# Patient Record
Sex: Male | Born: 1990
Health system: Southern US, Community
[De-identification: ages and names within clinical notes are randomized; demographics above are authoritative.]

## PROBLEM LIST (undated history)

## (undated) DIAGNOSIS — J3489 Other specified disorders of nose and nasal sinuses: Secondary | ICD-10-CM

## (undated) DIAGNOSIS — D693 Immune thrombocytopenic purpura: Secondary | ICD-10-CM

## (undated) DIAGNOSIS — R05 Cough: Secondary | ICD-10-CM

## (undated) DIAGNOSIS — S62309A Unspecified fracture of unspecified metacarpal bone, initial encounter for closed fracture: Secondary | ICD-10-CM

## (undated) HISTORY — PX: LYMPH NODE BIOPSY: SHX201

## (undated) HISTORY — PX: MANDIBLE FRACTURE SURGERY: SHX706

---
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
## 2011-01-04 ENCOUNTER — Emergency Department (HOSPITAL_COMMUNITY)
Admission: EM | Admit: 2011-01-04 | Discharge: 2011-01-04 | Disposition: A | Discharge status: Home / Self Care | Payer: Medicaid Other | Attending: Emergency Medicine | Admitting: Emergency Medicine

## 2011-01-04 ENCOUNTER — Emergency Department (HOSPITAL_COMMUNITY): Payer: Medicaid Other

## 2011-01-04 DIAGNOSIS — R059 Cough, unspecified: Secondary | ICD-10-CM | POA: Insufficient documentation

## 2011-01-04 DIAGNOSIS — R5381 Other malaise: Secondary | ICD-10-CM | POA: Insufficient documentation

## 2011-01-04 DIAGNOSIS — R220 Localized swelling, mass and lump, head: Secondary | ICD-10-CM | POA: Insufficient documentation

## 2011-01-04 DIAGNOSIS — R05 Cough: Secondary | ICD-10-CM | POA: Insufficient documentation

## 2011-01-04 DIAGNOSIS — J3489 Other specified disorders of nose and nasal sinuses: Secondary | ICD-10-CM | POA: Insufficient documentation

## 2011-01-04 DIAGNOSIS — R070 Pain in throat: Secondary | ICD-10-CM | POA: Insufficient documentation

## 2011-01-04 DIAGNOSIS — J069 Acute upper respiratory infection, unspecified: Secondary | ICD-10-CM | POA: Insufficient documentation

## 2011-01-04 LAB — RAPID STREP SCREEN (MED CTR MEBANE ONLY): Streptococcus, Group A Screen (Direct): NEGATIVE

## 2011-02-03 ENCOUNTER — Emergency Department (HOSPITAL_COMMUNITY)
Admission: EM | Admit: 2011-02-03 | Discharge: 2011-02-03 | Disposition: A | Discharge status: Home / Self Care | Payer: Medicaid Other | Attending: Emergency Medicine | Admitting: Emergency Medicine

## 2011-02-03 ENCOUNTER — Emergency Department (HOSPITAL_COMMUNITY): Payer: Medicaid Other

## 2011-02-03 DIAGNOSIS — W2209XA Striking against other stationary object, initial encounter: Secondary | ICD-10-CM | POA: Insufficient documentation

## 2011-02-03 DIAGNOSIS — M25569 Pain in unspecified knee: Secondary | ICD-10-CM | POA: Insufficient documentation

## 2011-02-26 ENCOUNTER — Emergency Department (HOSPITAL_COMMUNITY)
Admission: EM | Admit: 2011-02-26 | Discharge: 2011-02-26 | Disposition: A | Discharge status: Home / Self Care | Payer: Medicaid Other | Attending: Emergency Medicine | Admitting: Emergency Medicine

## 2011-02-26 DIAGNOSIS — H6690 Otitis media, unspecified, unspecified ear: Secondary | ICD-10-CM | POA: Insufficient documentation

## 2011-02-26 DIAGNOSIS — H9209 Otalgia, unspecified ear: Secondary | ICD-10-CM | POA: Insufficient documentation

## 2011-03-20 ENCOUNTER — Emergency Department (HOSPITAL_COMMUNITY)
Admission: EM | Admit: 2011-03-20 | Discharge: 2011-03-20 | Disposition: A | Discharge status: Home / Self Care | Payer: Medicaid Other | Attending: Emergency Medicine | Admitting: Emergency Medicine

## 2011-03-20 ENCOUNTER — Emergency Department (HOSPITAL_COMMUNITY): Payer: Medicaid Other

## 2011-03-20 DIAGNOSIS — S022XXA Fracture of nasal bones, initial encounter for closed fracture: Secondary | ICD-10-CM | POA: Insufficient documentation

## 2011-03-20 DIAGNOSIS — M25559 Pain in unspecified hip: Secondary | ICD-10-CM | POA: Insufficient documentation

## 2011-03-20 DIAGNOSIS — R220 Localized swelling, mass and lump, head: Secondary | ICD-10-CM | POA: Insufficient documentation

## 2011-03-20 DIAGNOSIS — R51 Headache: Secondary | ICD-10-CM | POA: Insufficient documentation

## 2011-03-20 DIAGNOSIS — J3489 Other specified disorders of nose and nasal sinuses: Secondary | ICD-10-CM | POA: Insufficient documentation

## 2011-03-20 DIAGNOSIS — Y929 Unspecified place or not applicable: Secondary | ICD-10-CM | POA: Insufficient documentation

## 2012-10-26 IMAGING — CR DG CHEST SPECIAL VIEW
1 series · 1 of 1 positions shown · non-contrast
Comparison: 01/04/2011.

CLINICAL DATA: Cough.

CHEST SPECIAL VIEW

[w chest pa]
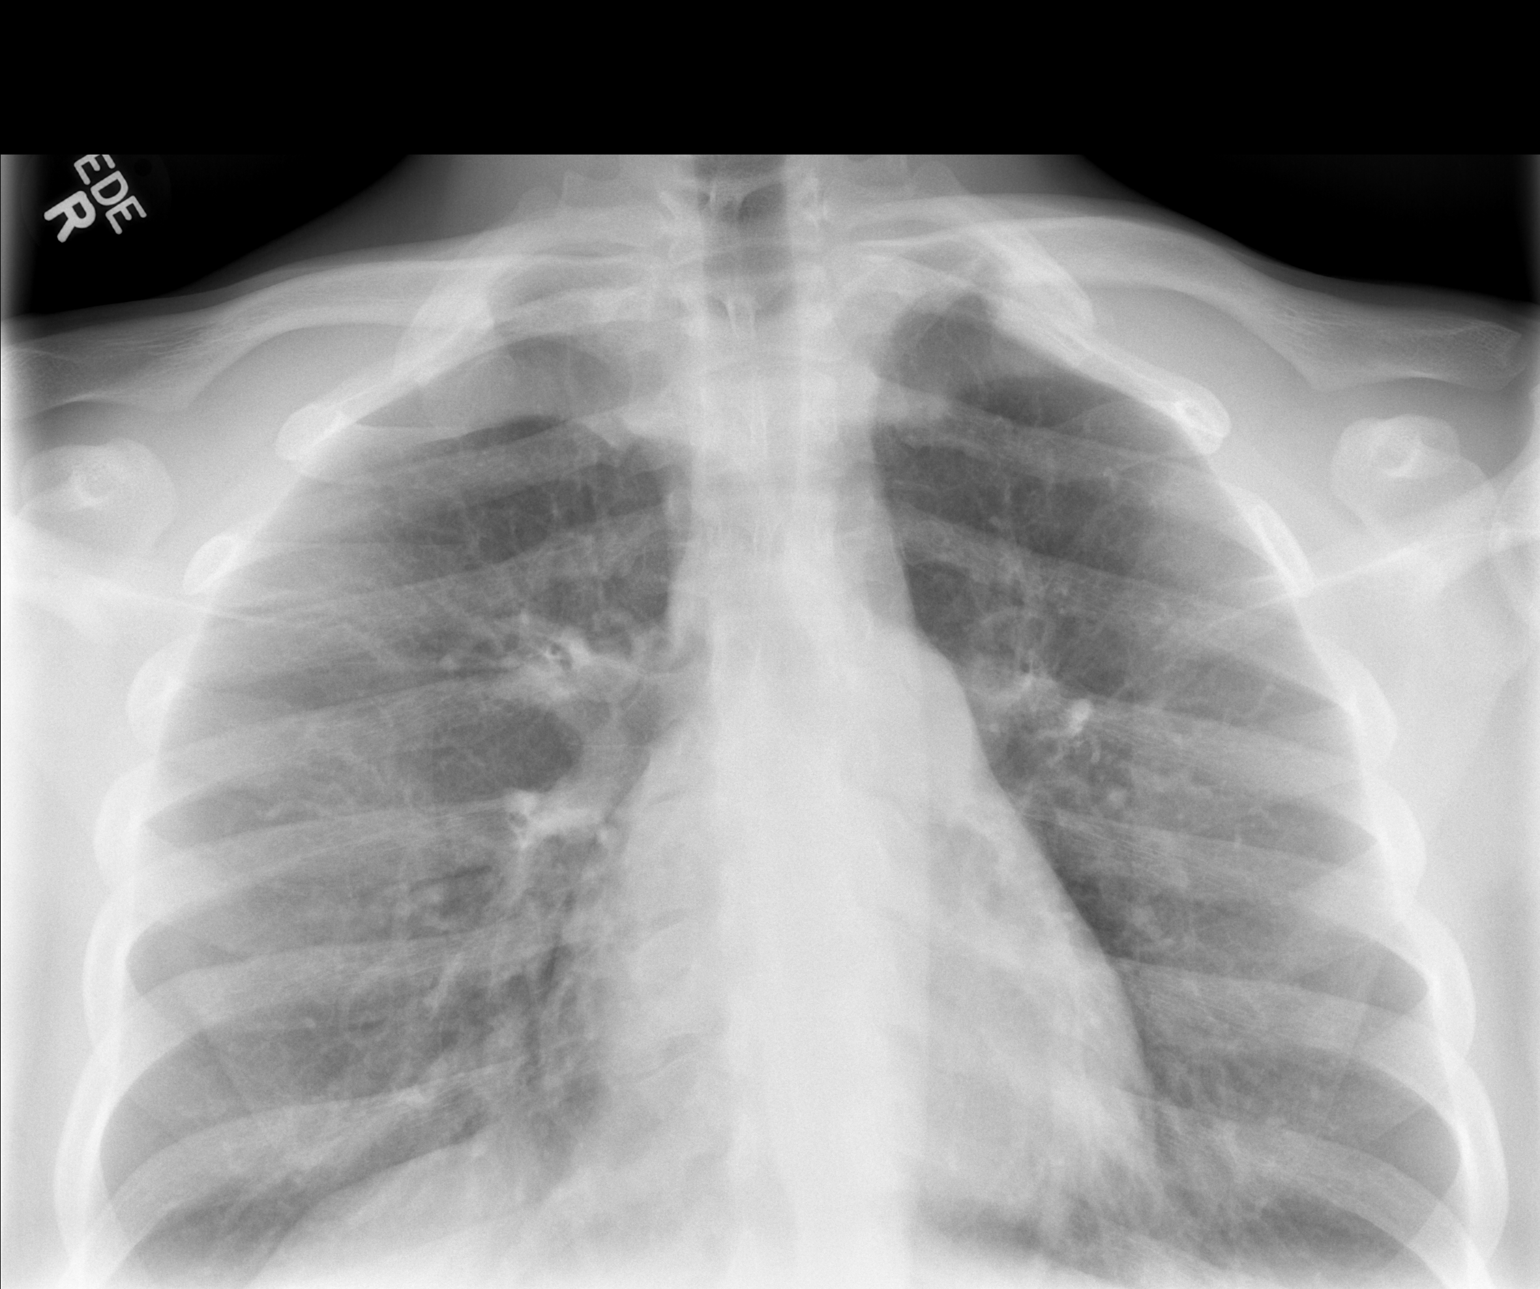

[1 of 1 positions shown; findings below may reference images not displayed]

FINDINGS: An apical lordotic  view demonstrates a poor inspiration.
Normal sized heart.  The previously seen nodular density overlying
the left lung apex appears to represent the normal anterior left
first rib.  No true lung nodule is seen on this view.  Mild central
peribronchial thickening.  Unremarkable bones.
IMPRESSION: The previously questioned left apical nodule is  the normal
anterior aspect of the left first rib.  Mild bronchitic changes.

## 2014-11-08 ENCOUNTER — Other Ambulatory Visit: Payer: Self-pay

## 2017-06-12 DIAGNOSIS — S62309A Unspecified fracture of unspecified metacarpal bone, initial encounter for closed fracture: Secondary | ICD-10-CM

## 2017-06-12 HISTORY — DX: Unspecified fracture of unspecified metacarpal bone, initial encounter for closed fracture: S62.309A

## 2017-06-13 ENCOUNTER — Encounter (HOSPITAL_COMMUNITY): Payer: Self-pay | Admitting: Emergency Medicine

## 2017-06-13 ENCOUNTER — Emergency Department (HOSPITAL_COMMUNITY): Payer: Medicaid Other

## 2017-06-13 ENCOUNTER — Emergency Department (HOSPITAL_COMMUNITY)
Admission: EM | Admit: 2017-06-13 | Discharge: 2017-06-13 | Disposition: A | Discharge status: Home / Self Care | Payer: Medicaid Other | Attending: Emergency Medicine | Admitting: Emergency Medicine

## 2017-06-13 DIAGNOSIS — Y999 Unspecified external cause status: Secondary | ICD-10-CM | POA: Insufficient documentation

## 2017-06-13 DIAGNOSIS — Y939 Activity, unspecified: Secondary | ICD-10-CM | POA: Insufficient documentation

## 2017-06-13 DIAGNOSIS — W2209XA Striking against other stationary object, initial encounter: Secondary | ICD-10-CM | POA: Insufficient documentation

## 2017-06-13 DIAGNOSIS — S62326A Displaced fracture of shaft of fifth metacarpal bone, right hand, initial encounter for closed fracture: Secondary | ICD-10-CM | POA: Insufficient documentation

## 2017-06-13 DIAGNOSIS — F17200 Nicotine dependence, unspecified, uncomplicated: Secondary | ICD-10-CM | POA: Insufficient documentation

## 2017-06-13 DIAGNOSIS — Y929 Unspecified place or not applicable: Secondary | ICD-10-CM | POA: Insufficient documentation

## 2017-06-13 MED ORDER — OXYCODONE-ACETAMINOPHEN 5-325 MG PO TABS
1.0000 | ORAL_TABLET | Freq: Once | ORAL | Status: AC
Start: 2017-06-13 — End: 2017-06-13
  Administered 2017-06-13: 1 via ORAL
  Filled 2017-06-13: qty 1

## 2017-06-13 MED ORDER — OXYCODONE-ACETAMINOPHEN 5-325 MG PO TABS: 1.0000 | ORAL_TABLET | Freq: Three times a day (TID) | ORAL | 0 refills | Status: DC | PRN

## 2017-06-13 NOTE — ED Provider Notes (Signed)
[facility]-EMERGENCY DEPT Provider Note   CSN: [id number] Arrival date & time: 06/13/17  1425  By signing my name below, I, Deland PrettySherilynn Knight, attest that this documentation has been prepared under the direction and in the presence of Amyiah Gaba, PA-C. Electronically Signed: Deland PrettySherilynn Knight, ED Scribe. 06/13/17. 5:06 PM.  History   Chief Complaint Chief Complaint  Patient presents with  . Hand Injury   The history is provided by the patient. No language interpreter was used.   HPI Comments: Gary Foley is a 26 y.o. male who presents to the Emergency Department complaining of gradually worsening right hand pain with associated swelling s/p an injury that occurred last night. Per note, the pt punched a window that did not break upon impact. Movement exacerbates his pain.The pt took one 200mg  of ibuprofen with inadequate relief. The pt is right-handed. He denies h/x of DM. NKDA. He denies numbness, SOB, chest pain, abdominal pain, back pain  Past Medical History:  Diagnosis Date  . History of ITP     There are no active problems to display for this patient.   History reviewed. No pertinent surgical history.     Home Medications    Prior to Admission medications   Medication Sig Start Date End Date Taking? Authorizing Provider  ibuprofen (ADVIL,MOTRIN) 200 MG tablet Take 400 mg by mouth every 6 (six) hours as needed for moderate pain.   Yes [provider]  oxyCODONE-acetaminophen (PERCOCET/ROXICET) 5-325 MG tablet Take 1 tablet by mouth every 8 (eight) hours as needed for severe pain. 06/13/17   Azaliah Carrero, Coral ElseMia A, PA-C    Family History History reviewed. No pertinent family history.  Social History Social History  Substance Use Topics  . Smoking status: Current Every Day Smoker  . Smokeless tobacco: Never Used  . Alcohol use Yes     Allergies   Patient has no known allergies.   Review of Systems Review of Systems  Constitutional: Negative for activity  change.  Respiratory: Negative for shortness of breath.   Cardiovascular: Negative for chest pain.  Gastrointestinal: Negative for abdominal pain.  Musculoskeletal: Negative for back pain.  Skin: Positive for wound. Negative for rash.  Neurological: Negative for numbness.     Physical Exam Updated Vital Signs BP 110/72 (BP Location: Right Arm)   Pulse 98   Temp 99 F (37.2 C)   Resp 17   SpO2 99%   Physical Exam  Constitutional: He appears well-developed.  HENT:  Head: Normocephalic.  Eyes: Conjunctivae are normal.  Neck: Neck supple.  Cardiovascular: Normal rate, regular rhythm and normal heart sounds.   No murmur heard. Pulses:      Radial pulses are 2+ on the right side, and 2+ on the left side.  Pulmonary/Chest: Effort normal.  Abdominal: Soft. He exhibits no distension.  Musculoskeletal:  Tender to palpation over the 5th metacarpal of the right hand and has good strength of the digits against resistance. Sensation intact. Radial pulses 2+.  Neurological: He is alert.  Skin: Skin is warm and dry.  Psychiatric: His behavior is normal.  Nursing note and vitals reviewed.  ED Treatments / Results   DIAGNOSTIC STUDIES: Oxygen Saturation is 96% on RA, adequate by my interpretation.   COORDINATION OF CARE: 4:51 PM-Discussed next steps with pt. Pt verbalized understanding and is agreeable with the plan.   Labs (all labs ordered are listed, but only abnormal results are displayed) Labs Reviewed - No data to display  EKG  EKG Interpretation None  Radiology Dg Hand Complete Right  Result Date: 06/13/2017 CLINICAL DATA:  Right hand pain after punching window last night. EXAM: RIGHT HAND - COMPLETE 3+ VIEW COMPARISON:  None. FINDINGS: Mildly displaced fracture involving the fifth metacarpal is noted. No other abnormality is noted. Joint spaces appear intact. No soft tissue abnormality is noted. IMPRESSION: Mildly displaced fifth metacarpal fracture.  Electronically Signed   By: Lupita RaiderJames  Green Jr, M.D.   On: 06/13/2017 15:17    Procedures Procedures (including critical care time)  Medications Ordered in ED Medications  oxyCODONE-acetaminophen (PERCOCET/ROXICET) 5-325 MG per tablet 1 tablet (1 tablet Oral Given 06/13/17 1703)     Initial Impression / Assessment and Plan / ED Course  I have reviewed the triage vital signs and the nursing notes.  Pertinent labs & imaging results that were available during my care of the patient were reviewed by me and considered in my medical decision making (see chart for details).     Patient X-Ray with displaced fracture to the shaft of the right fifth metacarpal. Pain managed in ED. Ulnar gutter splint placed in the emergency department. The patient was reevaluated after splint placement with good capillary refill and intact sensation. Pt advised to follow up with orthopedics for further evaluation and treatment including a cast after swelling improves.  Pain managed in the department.Will d/c the patient with a short course of pain medication. A 6025-month prescription history query was performed using the [place] CSRS prior to discharge. Patient will be dc home & is agreeable with above plan. I have also discussed reasons to return immediately to the ER.  Patient expresses understanding and agrees with plan.  Final Clinical Impressions(s) / ED Diagnoses   Final diagnoses:  Closed displaced fracture of shaft of fifth metacarpal bone of right hand, initial encounter    New Prescriptions Discharge Medication List as of 06/13/2017  5:38 PM     I personally performed the services described in this documentation, which was scribed in my presence. The recorded information has been reviewed and is accurate.     Barkley BoardsMcDonald, Kean Gautreau A, PA-C 06/13/17 1818    Hady Niemczyk, Coral ElseMia A, PA-C 06/13/17 2017    Tegeler, Canary Brimhristopher J, MD 06/14/17 [phone]

## 2017-06-13 NOTE — ED Triage Notes (Signed)
Pt reports R hand pain since last night when he punched a window. Window did not break, but pt has had pain in swelling in his posterior palm, worst towards pinky finger.

## 2017-06-13 NOTE — Discharge Instructions (Signed)
Please call and schedule a follow up appointment with Dr. Ronie SpiesWeingold's office to schedule a follow up appointment to have a cast placed early next week. You can take 800 mg of ibuprofen every 8 hours as needed for pain and inflammation control. You can apply ice to any areas that are sore for 15-20 minutes up to 3-4 times a day. If you have new or worsening symptoms, including numbness, weakness, or new injuries to the hand, please return to the emergency department for reevaluation.

## 2017-06-17 ENCOUNTER — Other Ambulatory Visit: Payer: Self-pay | Admitting: Orthopedic Surgery

## 2017-06-17 ENCOUNTER — Encounter (HOSPITAL_BASED_OUTPATIENT_CLINIC_OR_DEPARTMENT_OTHER): Payer: Self-pay | Admitting: *Deleted

## 2017-06-17 DIAGNOSIS — R059 Cough, unspecified: Secondary | ICD-10-CM

## 2017-06-17 DIAGNOSIS — J3489 Other specified disorders of nose and nasal sinuses: Secondary | ICD-10-CM

## 2017-06-17 HISTORY — DX: Other specified disorders of nose and nasal sinuses: J34.89

## 2017-06-17 HISTORY — DX: Cough, unspecified: R05.9

## 2017-06-18 ENCOUNTER — Encounter (HOSPITAL_BASED_OUTPATIENT_CLINIC_OR_DEPARTMENT_OTHER)
Admission: RE | Disposition: A | Discharge status: Home / Self Care | Payer: Self-pay | Source: Ambulatory Visit | Attending: Orthopedic Surgery

## 2017-06-18 ENCOUNTER — Ambulatory Visit (HOSPITAL_BASED_OUTPATIENT_CLINIC_OR_DEPARTMENT_OTHER)
Admission: RE | Admit: 2017-06-18 | Discharge: 2017-06-18 | Disposition: A | Discharge status: Home / Self Care | Payer: Self-pay | Source: Ambulatory Visit | Attending: Orthopedic Surgery | Admitting: Orthopedic Surgery

## 2017-06-18 ENCOUNTER — Ambulatory Visit (HOSPITAL_BASED_OUTPATIENT_CLINIC_OR_DEPARTMENT_OTHER): Payer: Self-pay | Admitting: Anesthesiology

## 2017-06-18 ENCOUNTER — Encounter (HOSPITAL_BASED_OUTPATIENT_CLINIC_OR_DEPARTMENT_OTHER): Payer: Self-pay | Admitting: Anesthesiology

## 2017-06-18 DIAGNOSIS — S62326A Displaced fracture of shaft of fifth metacarpal bone, right hand, initial encounter for closed fracture: Secondary | ICD-10-CM | POA: Insufficient documentation

## 2017-06-18 DIAGNOSIS — F17200 Nicotine dependence, unspecified, uncomplicated: Secondary | ICD-10-CM | POA: Insufficient documentation

## 2017-06-18 DIAGNOSIS — W2209XA Striking against other stationary object, initial encounter: Secondary | ICD-10-CM | POA: Insufficient documentation

## 2017-06-18 DIAGNOSIS — D759 Disease of blood and blood-forming organs, unspecified: Secondary | ICD-10-CM | POA: Insufficient documentation

## 2017-06-18 HISTORY — DX: Other specified disorders of nose and nasal sinuses: J34.89

## 2017-06-18 HISTORY — DX: Unspecified fracture of unspecified metacarpal bone, initial encounter for closed fracture: S62.309A

## 2017-06-18 HISTORY — PX: OPEN REDUCTION INTERNAL FIXATION (ORIF) METACARPAL: SHX6234

## 2017-06-18 HISTORY — DX: Immune thrombocytopenic purpura: D69.3

## 2017-06-18 HISTORY — DX: Cough: R05

## 2017-06-18 SURGERY — OPEN REDUCTION INTERNAL FIXATION (ORIF) METACARPAL
Anesthesia: General | Site: Hand | Laterality: Right

## 2017-06-18 MED ORDER — LIDOCAINE HCL (CARDIAC) 20 MG/ML IV SOLN
INTRAVENOUS | Status: DC | PRN
  Administered 2017-06-18: 30 mg via INTRAVENOUS

## 2017-06-18 MED ORDER — FENTANYL CITRATE (PF) 100 MCG/2ML IJ SOLN
INTRAMUSCULAR | Status: AC
  Filled 2017-06-18: qty 2

## 2017-06-18 MED ORDER — MIDAZOLAM HCL 2 MG/2ML IJ SOLN
INTRAMUSCULAR | Status: AC
  Filled 2017-06-18: qty 2

## 2017-06-18 MED ORDER — CHLORHEXIDINE GLUCONATE 4 % EX LIQD: 60.0000 mL | Freq: Once | CUTANEOUS | Status: DC

## 2017-06-18 MED ORDER — OXYCODONE-ACETAMINOPHEN 5-325 MG PO TABS: 1.0000 | ORAL_TABLET | ORAL | 0 refills | Status: DC | PRN

## 2017-06-18 MED ORDER — FENTANYL CITRATE (PF) 100 MCG/2ML IJ SOLN: 50.0000 ug | INTRAMUSCULAR | Status: DC | PRN

## 2017-06-18 MED ORDER — SCOPOLAMINE 1 MG/3DAYS TD PT72: 1.0000 | MEDICATED_PATCH | Freq: Once | TRANSDERMAL | Status: DC | PRN

## 2017-06-18 MED ORDER — ONDANSETRON HCL 4 MG/2ML IJ SOLN
INTRAMUSCULAR | Status: DC | PRN
  Administered 2017-06-18: 4 mg via INTRAVENOUS

## 2017-06-18 MED ORDER — CEFAZOLIN SODIUM-DEXTROSE 2-4 GM/100ML-% IV SOLN
INTRAVENOUS | Status: AC
  Filled 2017-06-18: qty 100

## 2017-06-18 MED ORDER — FENTANYL CITRATE (PF) 100 MCG/2ML IJ SOLN
INTRAMUSCULAR | Status: DC | PRN
  Administered 2017-06-18: 25 ug via INTRAVENOUS
  Administered 2017-06-18: 100 ug via INTRAVENOUS
  Administered 2017-06-18 (×5): 25 ug via INTRAVENOUS

## 2017-06-18 MED ORDER — OXYCODONE HCL 5 MG PO TABS
5.0000 mg | ORAL_TABLET | Freq: Once | ORAL | Status: AC
Start: 2017-06-18 — End: 2017-06-18
  Administered 2017-06-18: 5 mg via ORAL

## 2017-06-18 MED ORDER — LACTATED RINGERS IV SOLN
INTRAVENOUS | Status: DC
  Administered 2017-06-18 (×2): via INTRAVENOUS

## 2017-06-18 MED ORDER — HYDROMORPHONE HCL 1 MG/ML IJ SOLN
INTRAMUSCULAR | Status: AC
  Filled 2017-06-18: qty 0.5

## 2017-06-18 MED ORDER — ONDANSETRON HCL 4 MG/2ML IJ SOLN
INTRAMUSCULAR | Status: AC
  Filled 2017-06-18: qty 2

## 2017-06-18 MED ORDER — DEXAMETHASONE SODIUM PHOSPHATE 4 MG/ML IJ SOLN
INTRAMUSCULAR | Status: DC | PRN
  Administered 2017-06-18: 10 mg via INTRAVENOUS

## 2017-06-18 MED ORDER — MIDAZOLAM HCL 5 MG/5ML IJ SOLN
INTRAMUSCULAR | Status: DC | PRN
  Administered 2017-06-18: 2 mg via INTRAVENOUS

## 2017-06-18 MED ORDER — CEFAZOLIN SODIUM-DEXTROSE 2-4 GM/100ML-% IV SOLN
2.0000 g | INTRAVENOUS | Status: AC
  Administered 2017-06-18: 2 g via INTRAVENOUS

## 2017-06-18 MED ORDER — OXYCODONE HCL 5 MG PO TABS
ORAL_TABLET | ORAL | Status: AC
  Filled 2017-06-18: qty 1

## 2017-06-18 MED ORDER — HYDROMORPHONE HCL 1 MG/ML IJ SOLN
0.2500 mg | INTRAMUSCULAR | Status: DC | PRN
  Administered 2017-06-18: 0.5 mg via INTRAVENOUS

## 2017-06-18 MED ORDER — BUPIVACAINE HCL (PF) 0.25 % IJ SOLN
INTRAMUSCULAR | Status: DC | PRN
  Administered 2017-06-18: 10 mL

## 2017-06-18 MED ORDER — PROPOFOL 10 MG/ML IV BOLUS
INTRAVENOUS | Status: DC | PRN
  Administered 2017-06-18: 200 mg via INTRAVENOUS

## 2017-06-18 MED ORDER — LIDOCAINE 2% (20 MG/ML) 5 ML SYRINGE
INTRAMUSCULAR | Status: AC
  Filled 2017-06-18: qty 5

## 2017-06-18 MED ORDER — PROPOFOL 10 MG/ML IV BOLUS
INTRAVENOUS | Status: AC
  Filled 2017-06-18: qty 20

## 2017-06-18 MED ORDER — MIDAZOLAM HCL 2 MG/2ML IJ SOLN: 1.0000 mg | INTRAMUSCULAR | Status: DC | PRN

## 2017-06-18 MED ORDER — DEXAMETHASONE SODIUM PHOSPHATE 10 MG/ML IJ SOLN
INTRAMUSCULAR | Status: AC
  Filled 2017-06-18: qty 1

## 2017-06-18 SURGICAL SUPPLY — 67 items
APL SKNCLS STERI-STRIP NONHPOA (GAUZE/BANDAGES/DRESSINGS) ×1
BANDAGE ACE 3X5.8 VEL STRL LF (GAUZE/BANDAGES/DRESSINGS) IMPLANT
BANDAGE ACE 4X5 VEL STRL LF (GAUZE/BANDAGES/DRESSINGS) ×3 IMPLANT
BENZOIN TINCTURE PRP APPL 2/3 (GAUZE/BANDAGES/DRESSINGS) ×3 IMPLANT
BIT DRILL 1.1 (BIT) ×2
BIT DRILL 1.1MM (BIT) ×1
BIT DRILL 60X20X1.1XQC TMX (BIT) ×1 IMPLANT
BIT DRL 60X20X1.1XQC TMX (BIT) ×1
BLADE SURG 15 STRL LF DISP TIS (BLADE) ×1 IMPLANT
BLADE SURG 15 STRL SS (BLADE) ×3
BNDG CMPR 9X4 STRL LF SNTH (GAUZE/BANDAGES/DRESSINGS) ×1
BNDG ELASTIC 2X5.8 VLCR STR LF (GAUZE/BANDAGES/DRESSINGS) IMPLANT
BNDG ESMARK 4X9 LF (GAUZE/BANDAGES/DRESSINGS) ×3 IMPLANT
BNDG GAUZE ELAST 4 BULKY (GAUZE/BANDAGES/DRESSINGS) ×3 IMPLANT
CANISTER SUCT 1200ML W/VALVE (MISCELLANEOUS) IMPLANT
CLOSURE WOUND 1/2 X4 (GAUZE/BANDAGES/DRESSINGS) ×1
CORD BIPOLAR FORCEPS 12FT (ELECTRODE) ×3 IMPLANT
COVER BACK TABLE 60X90IN (DRAPES) ×3 IMPLANT
CUFF TOURNIQUET SINGLE 18IN (TOURNIQUET CUFF) ×3 IMPLANT
DECANTER SPIKE VIAL GLASS SM (MISCELLANEOUS) IMPLANT
DRAPE EXTREMITY T 121X128X90 (DRAPE) ×3 IMPLANT
DRAPE OEC MINIVIEW 54X84 (DRAPES) ×3 IMPLANT
DRAPE SURG 17X23 STRL (DRAPES) ×3 IMPLANT
DURAPREP 26ML APPLICATOR (WOUND CARE) ×3 IMPLANT
GAUZE SPONGE 4X4 12PLY STRL (GAUZE/BANDAGES/DRESSINGS) ×3 IMPLANT
GAUZE SPONGE 4X4 16PLY XRAY LF (GAUZE/BANDAGES/DRESSINGS) IMPLANT
GAUZE XEROFORM 1X8 LF (GAUZE/BANDAGES/DRESSINGS) IMPLANT
GLOVE SURG SYN 8.0 (GLOVE) ×3 IMPLANT
GOWN STRL REUS W/ TWL LRG LVL3 (GOWN DISPOSABLE) ×1 IMPLANT
GOWN STRL REUS W/TWL LRG LVL3 (GOWN DISPOSABLE) ×3
GOWN STRL REUS W/TWL XL LVL3 (GOWN DISPOSABLE) ×6 IMPLANT
NEEDLE HYPO 25X1 1.5 SAFETY (NEEDLE) IMPLANT
NS IRRIG 1000ML POUR BTL (IV SOLUTION) IMPLANT
PACK BASIN DAY SURGERY FS (CUSTOM PROCEDURE TRAY) ×3 IMPLANT
PAD CAST 3X4 CTTN HI CHSV (CAST SUPPLIES) ×1 IMPLANT
PAD CAST 4YDX4 CTTN HI CHSV (CAST SUPPLIES) IMPLANT
PADDING CAST ABS 4INX4YD NS (CAST SUPPLIES) ×2
PADDING CAST ABS COTTON 4X4 ST (CAST SUPPLIES) ×1 IMPLANT
PADDING CAST COTTON 3X4 STRL (CAST SUPPLIES) ×3
PADDING CAST COTTON 4X4 STRL (CAST SUPPLIES)
PADDING UNDERCAST 2 STRL (CAST SUPPLIES) ×2
PADDING UNDERCAST 2X4 STRL (CAST SUPPLIES) ×1 IMPLANT
PLATE STRAIGHT 1.5 6 HOLE (Plate) ×3 IMPLANT
SCREW CORTEX 1.5X10 (Screw) ×6 IMPLANT
SCREW CORTEX 1.5X11 (Screw) ×3 IMPLANT
SCREW CORTEX 1.5X12 (Screw) ×3 IMPLANT
SCREW CORTEX 1.5X8 (Screw) ×3 IMPLANT
SHEET MEDIUM DRAPE 40X70 STRL (DRAPES) ×3 IMPLANT
SLEEVE SCD COMPRESS KNEE MED (MISCELLANEOUS) ×3 IMPLANT
SPLINT PLASTER CAST XFAST 4X15 (CAST SUPPLIES) IMPLANT
SPLINT PLASTER XTRA FAST SET 4 (CAST SUPPLIES)
STOCKINETTE 4X48 STRL (DRAPES) ×3 IMPLANT
STRIP CLOSURE SKIN 1/2X4 (GAUZE/BANDAGES/DRESSINGS) ×2 IMPLANT
SUCTION FRAZIER HANDLE 10FR (MISCELLANEOUS)
SUCTION TUBE FRAZIER 10FR DISP (MISCELLANEOUS) IMPLANT
SUT MERSILENE 4 0 P 3 (SUTURE) IMPLANT
SUT PROLENE 3 0 PS 2 (SUTURE) ×3 IMPLANT
SUT VIC AB 4-0 P-3 18XBRD (SUTURE) IMPLANT
SUT VIC AB 4-0 P3 18 (SUTURE)
SUT VICRYL 4-0 PS2 18IN ABS (SUTURE) ×3 IMPLANT
SUT VICRYL+ 3-0 27IN RB-1 (SUTURE) IMPLANT
SYR 10ML LL (SYRINGE) IMPLANT
SYR BULB 3OZ (MISCELLANEOUS) IMPLANT
TOWEL OR 17X24 6PK STRL BLUE (TOWEL DISPOSABLE) ×3 IMPLANT
TUBE CONNECTING 20'X1/4 (TUBING)
TUBE CONNECTING 20X1/4 (TUBING) IMPLANT
UNDERPAD 30X30 (UNDERPADS AND DIAPERS) ×3 IMPLANT

## 2017-06-18 NOTE — Transfer of Care (Signed)
Immediate Anesthesia Transfer of Care Note  Patient: Gary GoldenStephen R Foley  Procedure(s) Performed: Procedure(s): OPEN REDUCTION INTERNAL FIXATION (ORIF) RIGHT SMALL METACARPAL FRACTURE (Right)  Patient Location: PACU  Anesthesia Type:General  Level of Consciousness: sedated and responds to stimulation  Airway & Oxygen Therapy: Patient Spontanous Breathing and Patient connected to face mask oxygen  Post-op Assessment: Report given to RN and Post -op Vital signs reviewed and stable  Post vital signs: Reviewed and stable  Last Vitals:  Vitals:   06/18/17 1150  BP: 134/82  Pulse: 78  Resp: 16  Temp: 36.7 C    Last Pain:  Vitals:   06/18/17 1150  TempSrc: Oral  PainSc: 8       Patients Stated Pain Goal: 3 (06/18/17 1150)  Complications: No apparent anesthesia complications

## 2017-06-18 NOTE — Anesthesia Preprocedure Evaluation (Addendum)
Anesthesia Evaluation  Patient identified by MRN, date of birth, ID band Patient awake    Reviewed: Allergy & Precautions, H&P , NPO status , Patient's Chart, lab work & pertinent test results  Airway Mallampati: I  TM Distance: >3 FB Neck ROM: Full    Dental no notable dental hx. (+) Teeth Intact, Dental Advisory Given   Pulmonary Current Smoker,    Pulmonary exam normal breath sounds clear to auscultation       Cardiovascular negative cardio ROS   Rhythm:Regular Rate:Normal     Neuro/Psych negative neurological ROS  negative psych ROS   GI/Hepatic negative GI ROS, Neg liver ROS,   Endo/Other  negative endocrine ROS  Renal/GU negative Renal ROS  negative genitourinary   Musculoskeletal   Abdominal   Peds  Hematology  (+) Blood dyscrasia, ,   Anesthesia Other Findings   Reproductive/Obstetrics negative OB ROS                            Anesthesia Physical Anesthesia Plan  ASA: II  Anesthesia Plan: General   Post-op Pain Management:    Induction: Intravenous  PONV Risk Score and Plan: 1 and Ondansetron, Dexamethasone and Midazolam  Airway Management Planned: LMA  Additional Equipment:   Intra-op Plan:   Post-operative Plan: Extubation in OR  Informed Consent: I have reviewed the patients History and Physical, chart, labs and discussed the procedure including the risks, benefits and alternatives for the proposed anesthesia with the patient or authorized representative who has indicated his/her understanding and acceptance.   Dental advisory given  Plan Discussed with: CRNA  Anesthesia Plan Comments:        Anesthesia Quick Evaluation

## 2017-06-18 NOTE — Anesthesia Postprocedure Evaluation (Signed)
Anesthesia Post Note  Patient: Gary Foley  Procedure(s) Performed: Procedure(s) (LRB): OPEN REDUCTION INTERNAL FIXATION (ORIF) RIGHT SMALL METACARPAL FRACTURE (Right)     Patient location during evaluation: PACU Anesthesia Type: General Level of consciousness: awake and alert Pain management: pain level controlled Vital Signs Assessment: post-procedure vital signs reviewed and stable Respiratory status: spontaneous breathing, nonlabored ventilation and respiratory function stable Cardiovascular status: blood pressure returned to baseline and stable Postop Assessment: no signs of nausea or vomiting Anesthetic complications: no    Last Vitals:  Vitals:   06/18/17 1441 06/18/17 1453  BP:  (!) 145/90  Pulse: 66 82  Resp: 16 16  Temp:  36.6 C    Last Pain:  Vitals:   06/18/17 1453  TempSrc:   PainSc: 3                  Laiyah Exline,W. EDMOND

## 2017-06-18 NOTE — Anesthesia Procedure Notes (Signed)
Procedure Name: LMA Insertion Date/Time: 06/18/2017 1:03 PM Performed by: Genevieve NorlanderLINKA, Haitham Dolinsky L Pre-anesthesia Checklist: Patient identified, Emergency Drugs available, Suction available, Patient being monitored and Timeout performed Patient Re-evaluated:Patient Re-evaluated prior to induction Oxygen Delivery Method: Circle system utilized Preoxygenation: Pre-oxygenation with 100% oxygen Induction Type: IV induction Ventilation: Mask ventilation without difficulty LMA: LMA inserted LMA Size: 5.0 Number of attempts: 1 Airway Equipment and Method: Bite block Placement Confirmation: positive ETCO2 Tube secured with: Tape Dental Injury: Teeth and Oropharynx as per pre-operative assessment

## 2017-06-18 NOTE — H&P (Signed)
Gary Foley is an 26 y.o. male.   Chief Complaint: Right hand pain and swelling HPI: Patient's a very pleasant 26 year old right-hand-dominant male status post right hand trauma with displaced fracture of his small metacarpal in the mid to distal third of the shaft  Past Medical History:  Diagnosis Date  . Cough 06/17/2017  . Idiopathic thrombocytopenia purpura (HCC)    no PCP  . Metacarpal bone fracture 06/12/2017   right small  . Stuffy and runny nose 06/17/2017   green/yellow drainage from nose, per pt.    Past Surgical History:  Procedure Laterality Date  . LYMPH NODE BIOPSY     neck - as a child  . MANDIBLE FRACTURE SURGERY      History reviewed. No pertinent family history. Social History:  reports that he has been smoking.  He has a 14.00 pack-year smoking history. He has never used smokeless tobacco. He reports that he drinks alcohol. He reports that he does not use drugs.  Allergies: No Known Allergies  Medications Prior to Admission  Medication Sig Dispense Refill  . oxyCODONE-acetaminophen (PERCOCET/ROXICET) 5-325 MG tablet Take 1 tablet by mouth every 8 (eight) hours as needed for severe pain. 9 tablet 0    No results found for this or any previous visit (from the past 48 hour(s)). No results found.  Review of Systems  All other systems reviewed and are negative.   Blood pressure 134/82, pulse 78, temperature 98 F (36.7 C), temperature source Oral, resp. rate 16, height 6\' 1"  (1.854 m), weight 78 kg (172 lb), SpO2 100 %. Physical Exam  Constitutional: He is oriented to person, place, and time. He appears well-developed and well-nourished.  HENT:  Head: Normocephalic and atraumatic.  Neck: Normal range of motion.  Cardiovascular: Normal rate.   Respiratory: Effort normal.  Musculoskeletal:       Right hand: He exhibits tenderness, bony tenderness and deformity.  Right hand pain, swelling, and deformity to the small finger distally at metacarpal  phalangeal joint  Neurological: He is alert and oriented to person, place, and time.  Skin: Skin is warm.  Psychiatric: He has a normal mood and affect. His behavior is normal. Judgment and thought content normal.     Assessment/Plan As above. Plan on open reduction and internal fixation of displaced right small metacarpal fracture as an outpatient.  Marlowe ShoresMatthew A Braden Deloach, MD 06/18/2017, 12:40 PM

## 2017-06-18 NOTE — Op Note (Signed)
Please see dictated report [phone]

## 2017-06-18 NOTE — Discharge Instructions (Signed)

## 2017-06-19 ENCOUNTER — Encounter (HOSPITAL_BASED_OUTPATIENT_CLINIC_OR_DEPARTMENT_OTHER): Payer: Self-pay | Admitting: Orthopedic Surgery

## 2017-06-19 NOTE — Op Note (Signed)
Gary Foley, Gary Foley               ACCOUNT NO.:  [id number]  MEDICAL RECORD NO.:  [id number]  LOCATION:                                 FACILITY:  PHYSICIAN:  Artist PaisMatthew A. Mina MarbleWeingold, M.D.   DATE OF BIRTH:  DATE OF PROCEDURE:  06/18/2017 DATE OF DISCHARGE:                              OPERATIVE REPORT   PREOPERATIVE DIAGNOSIS:  Displaced right small metacarpal fracture.  POSTOPERATIVE DIAGNOSIS:  Displaced right small metacarpal fracture.  PROCEDURE:  Open reduction internal fixation above using 1.5 mm plate and screw construct.  SURGEON:  Artist PaisMatthew A. Mina MarbleWeingold, M.D.  ASSISTANT:  None.  ANESTHESIA:  General.  COMPLICATION:  None.  DRAINS:  None.  DESCRIPTION OF PROCEDURE:  The patient was taken to the operating suite after induction of adequate general anesthetic.  Right upper extremity was prepped and draped in sterile fashion.  An Esmarch was used to exsanguinate the limb.  Tourniquet was inflated to 250 mmHg.  At this point in time, incision was made over the dorsal aspect of the right small finger.  The skin was incised sharply.  Blunt dissection was carried down the extensor tendons involving the small finger on the right side.  We developed the interval between the [facility] and EDQ tendons. These were retracted radially and ulnarly, and then a subperiosteal dissection was taken down to the fracture site.  This was debrided of clot.  Reduction was performed with longitudinal traction and downward pressure.  We then placed a single lag screw from dorsal to volar and from ulnar to radial under direct fluoroscopic guidance to stabilize the fracture.  We then placed a 6-hole plate just dorsal to the lag screw with 1 cortical screw distal and 3 cortical screws proximal. Intraoperative fluoroscopy revealed adequate reduction AP, lateral, oblique view.  The wound was irrigated and loosely closed in layers of 4- 0 Vicryl to cover the hardware and a 3-0 Prolene subcuticular stitch  on the skin.  Steri-Strips, 4x4s, fluffs, and an ulnar gutter splint was applied.  The patient tolerated this procedure well, went to recovery room in stable fashion.    Artist PaisMatthew A. Mina MarbleWeingold, M.D.    MAW/MEDQ  D:  06/18/2017  T:  06/18/2017  Job:  [id number]

## 2017-07-07 ENCOUNTER — Emergency Department (HOSPITAL_COMMUNITY)
Admission: EM | Admit: 2017-07-07 | Discharge: 2017-07-07 | Disposition: A | Discharge status: Home / Self Care | Payer: Self-pay | Attending: Emergency Medicine | Admitting: Emergency Medicine

## 2017-07-07 ENCOUNTER — Encounter (HOSPITAL_COMMUNITY): Payer: Self-pay | Admitting: Emergency Medicine

## 2017-07-07 DIAGNOSIS — L299 Pruritus, unspecified: Secondary | ICD-10-CM | POA: Insufficient documentation

## 2017-07-07 DIAGNOSIS — F17200 Nicotine dependence, unspecified, uncomplicated: Secondary | ICD-10-CM | POA: Insufficient documentation

## 2017-07-07 DIAGNOSIS — F1490 Cocaine use, unspecified, uncomplicated: Secondary | ICD-10-CM | POA: Insufficient documentation

## 2017-07-07 MED ORDER — PERMETHRIN 5 % EX CREA: TOPICAL_CREAM | CUTANEOUS | 0 refills | Status: AC

## 2017-07-07 NOTE — ED Triage Notes (Signed)
Patient states he has had worms come out of his body. Patient does not have any worms showing at this time.

## 2017-07-07 NOTE — Discharge Instructions (Signed)
Return if any problems.

## 2017-07-12 NOTE — ED Provider Notes (Signed)
[facility]-EMERGENCY DEPT Provider Note   CSN: [id number] Arrival date & time: 07/07/17  1905     History   Chief Complaint Chief Complaint  Patient presents with  . worms    HPI Gary Foley is a 26 y.o. male.  Pt complains of worms under his skin.  Pt reports he used cocaine today.  Pt reports he is able to pull worms out.      Rash   This is a new problem. The current episode started 2 days ago. The problem has not changed since onset.The problem is associated with nothing. There has been no fever. The rash is present on the torso. The pain is moderate. The pain has been constant since onset. He has tried nothing for the symptoms. The treatment provided no relief. Risk factors include new medications.    Past Medical History:  Diagnosis Date  . Cough 06/17/2017  . Idiopathic thrombocytopenia purpura (HCC)    no PCP  . Metacarpal bone fracture 06/12/2017   right small  . Stuffy and runny nose 06/17/2017   green/yellow drainage from nose, per pt.    There are no active problems to display for this patient.   Past Surgical History:  Procedure Laterality Date  . LYMPH NODE BIOPSY     neck - as a child  . MANDIBLE FRACTURE SURGERY    . OPEN REDUCTION INTERNAL FIXATION (ORIF) METACARPAL Right 06/18/2017   Procedure: OPEN REDUCTION INTERNAL FIXATION (ORIF) RIGHT SMALL METACARPAL FRACTURE;  Surgeon: Dairl Ponder, MD;  Location: [facility];  Service: Orthopedics;  Laterality: Right;       Home Medications    Prior to Admission medications   Medication Sig Start Date End Date Taking? Authorizing Provider  oxyCODONE-acetaminophen (PERCOCET/ROXICET) 5-325 MG tablet Take 1 tablet by mouth every 8 (eight) hours as needed for severe pain. 06/13/17   McDonald, Mia A, PA-C  oxyCODONE-acetaminophen (ROXICET) 5-325 MG tablet Take 1 tablet by mouth every 4 (four) hours as needed for severe pain. 06/18/17   Dairl Ponder, MD  permethrin (ELIMITE) 5 % cream  Apply to affected area once 07/07/17   Elson Areas, PA-C    Family History History reviewed. No pertinent family history.  Social History Social History  Substance Use Topics  . Smoking status: Current Every Day Smoker    Packs/day: 1.00    Years: 14.00  . Smokeless tobacco: Never Used  . Alcohol use Yes     Comment: 1 beer/day     Allergies   Patient has no known allergies.   Review of Systems Review of Systems  Skin: Negative for rash.  All other systems reviewed and are negative.    Physical Exam Updated Vital Signs BP (!) 160/105 (BP Location: Right Arm)   Pulse (!) 106   Temp 98.1 F (36.7 C) (Oral)   Resp 16   Ht 6\' 1"  (1.854 m)   Wt 78 kg (172 lb)   SpO2 96%   BMI 22.69 kg/m   Physical Exam  Constitutional: He appears well-developed and well-nourished.  HENT:  Head: Normocephalic and atraumatic.  Eyes: Conjunctivae are normal.  Neck: Neck supple.  Cardiovascular: Normal rate and regular rhythm.   No murmur heard. Pulmonary/Chest: Effort normal and breath sounds normal. No respiratory distress.  Abdominal: Soft. There is no tenderness.  Musculoskeletal: He exhibits no edema.  Neurological: He is alert.  Skin: Skin is warm and dry. Rash noted.  Pt picking off dry skin.  Pt has  small red areas,    Psychiatric: He has a normal mood and affect.  Nursing note and vitals reviewed.    ED Treatments / Results  Labs (all labs ordered are listed, but only abnormal results are displayed) Labs Reviewed - No data to display  EKG  EKG Interpretation None       Radiology No results found.  Procedures Procedures (including critical care time)  Medications Ordered in ED Medications - No data to display Pt might have scabies,  No worms.   I will treat him with elemite.   Initial Impression / Assessment and Plan / ED Course  I have reviewed the triage vital signs and the nursing notes.  Pertinent labs & imaging results that were available  during my care of the patient were reviewed by me and considered in my medical decision making (see chart for details).       Final Clinical Impressions(s) / ED Diagnoses   Final diagnoses:  Itching    New Prescriptions Discharge Medication List as of 07/07/2017  8:41 PM    START taking these medications   Details  permethrin (ELIMITE) 5 % cream Apply to affected area once, Print      An After Visit Summary was printed and given to the patient.    Elson Areas, [place] 07/12/17 1601    Pricilla Loveless, MD 07/15/17 [phone]

## 2017-08-26 ENCOUNTER — Encounter (HOSPITAL_COMMUNITY): Payer: Self-pay | Admitting: Emergency Medicine

## 2017-08-26 DIAGNOSIS — R1013 Epigastric pain: Secondary | ICD-10-CM | POA: Insufficient documentation

## 2017-08-26 DIAGNOSIS — Z79899 Other long term (current) drug therapy: Secondary | ICD-10-CM | POA: Insufficient documentation

## 2017-08-26 DIAGNOSIS — R112 Nausea with vomiting, unspecified: Secondary | ICD-10-CM | POA: Insufficient documentation

## 2017-08-26 DIAGNOSIS — F17200 Nicotine dependence, unspecified, uncomplicated: Secondary | ICD-10-CM | POA: Insufficient documentation

## 2017-08-26 NOTE — ED Triage Notes (Signed)
Patient complaining of abdominal pain all over. Patient states he has had this stomach pain for months but today he couldn't take it. He states he is vomiting and can not keep anything down.

## 2017-08-27 ENCOUNTER — Emergency Department (HOSPITAL_COMMUNITY)
Admission: EM | Admit: 2017-08-27 | Discharge: 2017-08-27 | Disposition: A | Discharge status: Home / Self Care | Payer: Self-pay | Attending: Emergency Medicine | Admitting: Emergency Medicine

## 2017-08-27 DIAGNOSIS — R1013 Epigastric pain: Secondary | ICD-10-CM

## 2017-08-27 LAB — COMPREHENSIVE METABOLIC PANEL
ALBUMIN: 4.1 g/dL (ref 3.5–5.0)
ALK PHOS: 53 U/L (ref 38–126)
ALT: 27 U/L (ref 17–63)
AST: 29 U/L (ref 15–41)
Anion gap: 9 (ref 5–15)
BILIRUBIN TOTAL: 0.8 mg/dL (ref 0.3–1.2)
BUN: 7 mg/dL (ref 6–20)
CO2: 26 mmol/L (ref 22–32)
CREATININE: 0.93 mg/dL (ref 0.61–1.24)
Calcium: 8.7 mg/dL — ABNORMAL LOW (ref 8.9–10.3)
Chloride: 102 mmol/L (ref 101–111)
GFR calc Af Amer: >60 mL/min (ref >60)
GLUCOSE: 89 mg/dL (ref 65–99)
POTASSIUM: 3.5 mmol/L (ref 3.5–5.1)
Sodium: 137 mmol/L (ref 135–145)
TOTAL PROTEIN: 6.4 g/dL — AB (ref 6.5–8.1)

## 2017-08-27 LAB — URINALYSIS, ROUTINE W REFLEX MICROSCOPIC
BILIRUBIN URINE: NEGATIVE
GLUCOSE, UA: NEGATIVE mg/dL
Hgb urine dipstick: NEGATIVE
KETONES UR: NEGATIVE mg/dL
LEUKOCYTES UA: NEGATIVE
NITRITE: NEGATIVE
PH: 6 (ref 5.0–8.0)
PROTEIN: NEGATIVE mg/dL
Specific Gravity, Urine: 1.02 (ref 1.005–1.030)

## 2017-08-27 LAB — CBC
HEMATOCRIT: 44.6 % (ref 39.0–52.0)
Hemoglobin: 15.7 g/dL (ref 13.0–17.0)
MCH: 33.5 pg (ref 26.0–34.0)
MCHC: 35.2 g/dL (ref 30.0–36.0)
MCV: 95.3 fL (ref 78.0–100.0)
PLATELETS: 197 10*3/uL (ref 150–400)
RBC: 4.68 MIL/uL (ref 4.22–5.81)
RDW: 13.1 % (ref 11.5–15.5)
WBC: 6.7 10*3/uL (ref 4.0–10.5)

## 2017-08-27 LAB — LIPASE, BLOOD: Lipase: 31 U/L (ref 11–51)

## 2017-08-27 MED ORDER — OMEPRAZOLE 20 MG PO CPDR: 20.0000 mg | DELAYED_RELEASE_CAPSULE | Freq: Every day | ORAL | 0 refills | Status: AC

## 2017-08-27 MED ORDER — ONDANSETRON 4 MG PO TBDP
4.0000 mg | ORAL_TABLET | Freq: Once | ORAL | Status: AC
  Administered 2017-08-27: 4 mg via ORAL
  Filled 2017-08-27: qty 1

## 2017-08-27 MED ORDER — GI COCKTAIL ~~LOC~~
30.0000 mL | Freq: Once | ORAL | Status: AC
  Administered 2017-08-27: 30 mL via ORAL
  Filled 2017-08-27: qty 30

## 2017-08-27 NOTE — ED Provider Notes (Signed)
[facility]-EMERGENCY DEPT Provider Note   CSN: [id number] Arrival date & time: 08/26/17  2152     History   Chief Complaint Chief Complaint  Patient presents with  . Abdominal Pain    HPI Gary Foley is a 26 y.o. male.  HPI  This is a 26 yo male with a history of ITP who presents with abdominal pain. Patient reports a 3 month history of waxing and waning abdominal pain. Mostly epigastric and crampy in nature. Worsening over the last 24 hours. He reports associated nonbilious, nonbloody emesis. He has taken Nexium and Dramamine without any relief. Current pain is rated 8 out of 10.  Reports occasional alcohol use. Reports daily marijuana use. Denies any chest pain, shortness of breath, fevers, urinary symptoms.  Past Medical History:  Diagnosis Date  . Cough 06/17/2017  . Idiopathic thrombocytopenia purpura (HCC)    no PCP  . Metacarpal bone fracture 06/12/2017   right small  . Stuffy and runny nose 06/17/2017   green/yellow drainage from nose, per pt.    There are no active problems to display for this patient.   Past Surgical History:  Procedure Laterality Date  . LYMPH NODE BIOPSY     neck - as a child  . MANDIBLE FRACTURE SURGERY    . OPEN REDUCTION INTERNAL FIXATION (ORIF) METACARPAL Right 06/18/2017   Procedure: OPEN REDUCTION INTERNAL FIXATION (ORIF) RIGHT SMALL METACARPAL FRACTURE;  Surgeon: Dairl Ponder, MD;  Location: [facility];  Service: Orthopedics;  Laterality: Right;       Home Medications    Prior to Admission medications   Medication Sig Start Date End Date Taking? Authorizing Provider  omeprazole (PRILOSEC) 20 MG capsule Take 1 capsule (20 mg total) by mouth daily. 08/27/17   Hester Forget, Mayer Masker, MD  oxyCODONE-acetaminophen (PERCOCET/ROXICET) 5-325 MG tablet Take 1 tablet by mouth every 8 (eight) hours as needed for severe pain. 06/13/17   McDonald, Mia A, PA-C  oxyCODONE-acetaminophen (ROXICET) 5-325 MG tablet Take 1 tablet by  mouth every 4 (four) hours as needed for severe pain. 06/18/17   Dairl Ponder, MD  permethrin (ELIMITE) 5 % cream Apply to affected area once 07/07/17   Elson Areas, PA-C    Family History History reviewed. No pertinent family history.  Social History Social History  Substance Use Topics  . Smoking status: Current Every Day Smoker    Packs/day: 1.00    Years: 14.00  . Smokeless tobacco: Never Used  . Alcohol use Yes     Comment: 1 beer/day     Allergies   Patient has no known allergies.   Review of Systems Review of Systems  Constitutional: Negative for fever.  Respiratory: Negative for shortness of breath.   Cardiovascular: Negative for chest pain.  Gastrointestinal: Positive for abdominal pain, nausea and vomiting. Negative for blood in stool.  Genitourinary: Negative for dysuria.  All other systems reviewed and are negative.    Physical Exam Updated Vital Signs BP 139/88 (BP Location: Right Arm)   Pulse 75   Temp 98 F (36.7 C) (Oral)   Resp 14   Ht  (1.854 m)   Wt 77.7 kg (171 lb 6.4 oz)   SpO2 96%   BMI 22.61 kg/m   Physical Exam  Constitutional: He is oriented to person, place, and time. He appears well-developed and well-nourished. No distress.  Smells of marijuana  HENT:  Head: Normocephalic and atraumatic.  Cardiovascular: Normal rate, regular rhythm and normal heart sounds.  No murmur heard. Pulmonary/Chest: Effort normal and breath sounds normal. No respiratory distress. He has no wheezes.  Abdominal: Soft. Bowel sounds are normal. There is tenderness. There is no rebound.  Epigastric tenderness to palpation without rebound or guarding  Neurological: He is alert and oriented to person, place, and time.  Skin: Skin is warm and dry.  Psychiatric: He has a normal mood and affect.  Nursing note and vitals reviewed.    ED Treatments / Results  Labs (all labs ordered are listed, but only abnormal results are displayed) Labs Reviewed    COMPREHENSIVE METABOLIC PANEL - Abnormal; Notable for the following:       Result Value   Calcium 8.7 (*)    Total Protein 6.4 (*)    All other components within normal limits  LIPASE, BLOOD  CBC  URINALYSIS, ROUTINE W REFLEX MICROSCOPIC    EKG  EKG Interpretation None       Radiology No results found.  Procedures Procedures (including critical care time)  Medications Ordered in ED Medications  gi cocktail (Maalox,Lidocaine,Donnatal) (30 mLs Oral Given 08/27/17 0158)  ondansetron (ZOFRAN-ODT) disintegrating tablet 4 mg (4 mg Oral Given 08/27/17 0158)     Initial Impression / Assessment and Plan / ED Course  I have reviewed the triage vital signs and the nursing notes.  Pertinent labs & imaging results that were available during my care of the patient were reviewed by me and considered in my medical decision making (see chart for details).     Patient presents with abdominal pain. Acute on chronic. He is nontoxic-appearing. Tenderness on exam without signs of peritonitis. Suspect gastritis versus GERD versus cyclic vomiting secondary to marijuana use. Lab work is reassuring. Patient was given a GI cocktail and Zofran. He is able to orally hydrate and is requesting discharge. Recommend daily omeprazole. GI follow-up provided.  After history, exam, and medical workup I feel the patient has been appropriately medically screened and is safe for discharge home. Pertinent diagnoses were discussed with the patient. Patient was given return precautions.   Final Clinical Impressions(s) / ED Diagnoses   Final diagnoses:  Epigastric pain    New Prescriptions New Prescriptions   OMEPRAZOLE (PRILOSEC) 20 MG CAPSULE    Take 1 capsule (20 mg total) by mouth daily.     Shon Baton, MD 08/27/17 [phone]

## 2017-08-27 NOTE — ED Notes (Signed)
Patient is aware a urine specimen is needed- urinal given to patient. Patient stated "I can try to give a urine specimen but I don't think I can go right now".

## 2017-08-27 NOTE — ED Notes (Signed)
Patient given coke and crackers. 

## 2017-10-03 ENCOUNTER — Encounter (HOSPITAL_COMMUNITY): Payer: Self-pay | Admitting: Nurse Practitioner

## 2017-10-03 ENCOUNTER — Emergency Department (HOSPITAL_COMMUNITY)
Admission: EM | Admit: 2017-10-03 | Discharge: 2017-10-03 | Disposition: A | Discharge status: Home / Self Care | Payer: Self-pay | Attending: Emergency Medicine | Admitting: Emergency Medicine

## 2017-10-03 DIAGNOSIS — M79641 Pain in right hand: Secondary | ICD-10-CM | POA: Insufficient documentation

## 2017-10-03 DIAGNOSIS — F17200 Nicotine dependence, unspecified, uncomplicated: Secondary | ICD-10-CM | POA: Insufficient documentation

## 2017-10-03 DIAGNOSIS — Z79899 Other long term (current) drug therapy: Secondary | ICD-10-CM | POA: Insufficient documentation

## 2017-10-03 DIAGNOSIS — Z760 Encounter for issue of repeat prescription: Secondary | ICD-10-CM | POA: Insufficient documentation

## 2017-10-03 MED ORDER — OXYCODONE-ACETAMINOPHEN 5-325 MG PO TABS
2.0000 | ORAL_TABLET | Freq: Once | ORAL | Status: AC
  Administered 2017-10-03: 2 via ORAL
  Filled 2017-10-03: qty 2

## 2017-10-03 MED ORDER — ACETAMINOPHEN 500 MG PO TABS
500.0000 mg | ORAL_TABLET | Freq: Once | ORAL | Status: AC
  Administered 2017-10-03: 500 mg via ORAL
  Filled 2017-10-03: qty 1

## 2017-10-03 NOTE — Discharge Instructions (Signed)
Please take 1000 mg of Tylenol every 8 hours for pain.  Follow-up with your scheduled surgery on Monday.  Return to ED for any discoloration, paleness or tingling to her fingertips  Percocet is a narcotic pain medication that has risk of overdose, death, dependence and abuse. Mild and expected side effects include nausea, stomach upset, drowsiness, constipation. The emergency department has a strict policy regarding prescription of narcotic medications. We are unable to refill this medication in the emergency department for chronic pain or if another provider is currently treating your pain or condition. Contact your primary care provider or specialist for management and refill on narcotic medications.

## 2017-10-03 NOTE — ED Triage Notes (Signed)
Pt states he is awaiting surgery on Monday for his right wrist fracture. States that he called his orthopedist to report that he is out of pain medication and was advised to come into the ER and request pain medication to run him through Monday.

## 2017-10-03 NOTE — ED Provider Notes (Signed)
Gary Foley   CSN: [id number] Arrival date & time: 10/03/17  1756     History   Chief Complaint Chief Complaint  Patient presents with  . Requesting Pain Medications    HPI Gary Foley with recently diagnosed closed proximal fourth metacarpal fracture of the right hand awaiting surgery in 2 days presents to ED for medication refill of Percocet. States he called surgeon's office to notify them that he had ran out of pain medicines and he was told to come to the ED for refills. Admits he has been taking double the prescribed dose because he "could still feel it" and so has ran out of medicines 2 days early. States he has ITP and cannot take ibuprofen. Has not tried Tylenol. Pain is worse with movement of the right upper extremity. Denies numbness, tingling or pallor of right digits. No fevers. He is right-hand dominant.  HPI  Past Medical History:  Diagnosis Date  . Cough 06/17/2017  . Idiopathic thrombocytopenia purpura (HCC)    no PCP  . Metacarpal bone fracture 06/12/2017   right small  . Stuffy and runny nose 06/17/2017   green/yellow drainage from nose, per pt.    There are no active problems to display for this patient.   Past Surgical History:  Procedure Laterality Date  . LYMPH NODE BIOPSY     neck - as a child  . MANDIBLE FRACTURE SURGERY    . OPEN REDUCTION INTERNAL FIXATION (ORIF) RIGHT SMALL METACARPAL FRACTURE Right 06/18/2017   Performed by Gary Foley at [facility]       Home Medications    Prior to Admission medications   Medication Sig Start Date End Date Taking? Authorizing Provider  omeprazole (PRILOSEC) 20 MG capsule Take 1 capsule (20 mg total) by mouth daily. 08/27/17   Gary Foley  oxyCODONE-acetaminophen (PERCOCET/ROXICET) 5-325 MG tablet Take 1 tablet by mouth every 8 (eight) hours as needed for severe pain. 06/13/17   Gary Foley  oxyCODONE-acetaminophen (ROXICET) 5-325 MG tablet Take 1 tablet by mouth every 4 (four) hours as needed for severe pain. 06/18/17   Gary Foley  permethrin (ELIMITE) 5 % cream Apply to affected area once 07/07/17   Gary Foley    Family History History reviewed. No pertinent family history.  Social History Social History   Tobacco Use  . Smoking status: Current Every Day Smoker    Packs/day: 1.00    Years: 14.00    Pack years: 14.00  . Smokeless tobacco: Never Used  Substance Use Topics  . Alcohol use: Yes    Comment: 1 beer/day  . Drug use: No     Allergies   Patient has no known allergies.   Review of Systems Review of Systems  Musculoskeletal: Positive for arthralgias.  All other systems reviewed and are negative.    Physical Exam Updated Vital Signs BP (!) 141/90 (BP Location: Left Arm)   Pulse (!) 105   Temp 98.1 F (36.7 C) (Oral)   Resp 14   SpO2 100%   Physical Exam  Constitutional: He is oriented to person, place, and time. He appears well-developed and well-nourished. No distress.  NAD.  HENT:  Head: Normocephalic and atraumatic.  Right Ear: External ear normal.  Left Ear: External ear normal.  Nose: Nose normal.  Eyes: Conjunctivae and EOM are normal. No scleral icterus.  Neck: Normal range of  motion. Neck supple.  Cardiovascular: Normal rate, regular rhythm, normal heart sounds and intact distal pulses.  No murmur heard. Pulmonary/Chest: Effort normal and breath sounds normal. He has no wheezes.  Musculoskeletal: Normal range of motion. He exhibits no deformity.  Right hand in ulnar gutter splint Exposed digits are pink without pallor  Neurological: He is alert and oriented to person, place, and time.  Sensation to light touch intact in right digits  Skin: Skin is warm and dry. Capillary refill takes less than 2 seconds.  Psychiatric: He has a normal mood and affect. His behavior is normal. Judgment and thought content  normal.  Nursing Foley and vitals reviewed.    ED Treatments / Results  Labs (all labs ordered are listed, but only abnormal results are displayed) Labs Reviewed - No data to display  EKG  EKG Interpretation None       Radiology No results found.  Procedures Procedures (including critical care time)  Medications Ordered in ED Medications  oxyCODONE-acetaminophen (PERCOCET/ROXICET) 5-325 MG per tablet 2 tablet (2 tablets Oral Given 10/03/17 2025)  acetaminophen (TYLENOL) tablet 500 mg (500 mg Oral Given 10/03/17 2025)     Initial Impression / Assessment and Plan / ED Course  I have reviewed the triage vital signs and the nursing notes.  Pertinent labs & imaging results that were available during my care of the patient were reviewed by me and considered in my medical decision making (see chart for details).    26 year old presents for medication refill of Percocet. He has recently diagnosed fracture of the right hand and ran out of his Percocet today. He is awaiting surgery in 2 days. Has not tried any other OTC NSAIDs for pain. [employer] [place] checked, he was given 4 days worth of Percocet. He ran out in 2 days. He does admit to doubling of the toes to better control pain. Exam is reassuring. I did observe patient use his right hand to show me his paperwork and also put something in his right pocket without obvious difficulty. Discussed strict ED policy with prescription of narcotic pain meds. We'll give a dose of Percocet in the ED, we'll discharge with high-dose Tylenol. He verbalized understanding and is agreeable to with ED treatment and discharge plan.  Final Clinical Impressions(s) / ED Diagnoses   Final diagnoses:  Medication refill    ED Discharge Orders    None       Jerrell MylarGibbons, Aeralyn Barna J, Foley 10/03/17 2041    Rolland PorterJames, Mark, Foley 10/03/17 [phone]

## 2017-10-03 NOTE — ED Notes (Signed)
Attempted to d/c pt, but pt is not in room. RN Freida Busmanllen states pt was last seen around room 18, but pt has not been seen around the department.

## 2017-10-04 ENCOUNTER — Encounter (HOSPITAL_COMMUNITY): Payer: Self-pay

## 2017-10-04 ENCOUNTER — Emergency Department (HOSPITAL_COMMUNITY)
Admission: EM | Admit: 2017-10-04 | Discharge: 2017-10-04 | Payer: Self-pay | Attending: Emergency Medicine | Admitting: Emergency Medicine

## 2017-10-04 ENCOUNTER — Other Ambulatory Visit: Payer: Self-pay

## 2017-10-04 ENCOUNTER — Emergency Department (HOSPITAL_COMMUNITY): Payer: Self-pay

## 2017-10-04 DIAGNOSIS — Y9389 Activity, other specified: Secondary | ICD-10-CM | POA: Insufficient documentation

## 2017-10-04 DIAGNOSIS — S62314A Displaced fracture of base of fourth metacarpal bone, right hand, initial encounter for closed fracture: Secondary | ICD-10-CM | POA: Insufficient documentation

## 2017-10-04 DIAGNOSIS — F17200 Nicotine dependence, unspecified, uncomplicated: Secondary | ICD-10-CM | POA: Insufficient documentation

## 2017-10-04 DIAGNOSIS — W228XXA Striking against or struck by other objects, initial encounter: Secondary | ICD-10-CM | POA: Insufficient documentation

## 2017-10-04 DIAGNOSIS — Y999 Unspecified external cause status: Secondary | ICD-10-CM | POA: Insufficient documentation

## 2017-10-04 DIAGNOSIS — Y92149 Unspecified place in prison as the place of occurrence of the external cause: Secondary | ICD-10-CM | POA: Insufficient documentation
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
## 2017-10-04 NOTE — ED Notes (Signed)
Ortho tech paged for ulnar gutter splint
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
## 2017-10-04 NOTE — ED Notes (Signed)
Bed: WA01 Expected date:  Expected time:  Means of arrival:  Comments: 
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
## 2019-11-01 ENCOUNTER — Emergency Department (HOSPITAL_COMMUNITY): Admission: EM | Admit: 2019-11-01 | Discharge: 2019-11-01 | Discharge status: Absent without leave | Payer: Self-pay

## 2019-11-01 ENCOUNTER — Other Ambulatory Visit: Payer: Self-pay

## 2019-11-03 ENCOUNTER — Encounter (HOSPITAL_COMMUNITY): Payer: Self-pay

## 2019-11-03 ENCOUNTER — Other Ambulatory Visit: Payer: Self-pay

## 2019-11-03 ENCOUNTER — Emergency Department (HOSPITAL_COMMUNITY)
Admission: EM | Admit: 2019-11-03 | Discharge: 2019-11-03 | Disposition: A | Discharge status: Home / Self Care | Payer: Self-pay | Attending: Emergency Medicine | Admitting: Emergency Medicine

## 2019-11-03 DIAGNOSIS — Y9389 Activity, other specified: Secondary | ICD-10-CM | POA: Insufficient documentation

## 2019-11-03 DIAGNOSIS — Y999 Unspecified external cause status: Secondary | ICD-10-CM | POA: Insufficient documentation

## 2019-11-03 DIAGNOSIS — Y92513 Shop (commercial) as the place of occurrence of the external cause: Secondary | ICD-10-CM | POA: Insufficient documentation

## 2019-11-03 DIAGNOSIS — S62346A Nondisplaced fracture of base of fifth metacarpal bone, right hand, initial encounter for closed fracture: Secondary | ICD-10-CM | POA: Insufficient documentation

## 2019-11-03 DIAGNOSIS — F17200 Nicotine dependence, unspecified, uncomplicated: Secondary | ICD-10-CM | POA: Insufficient documentation

## 2019-11-03 MED ORDER — OXYCODONE-ACETAMINOPHEN 5-325 MG PO TABS: 1.0000 | ORAL_TABLET | Freq: Three times a day (TID) | ORAL | 0 refills | Status: AC | PRN

## 2019-11-03 NOTE — Discharge Instructions (Signed)
Please call and follow up with hand specialist for further management of your broken hand injury.
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
## 2020-04-13 ENCOUNTER — Encounter (HOSPITAL_COMMUNITY): Payer: Self-pay | Admitting: Emergency Medicine

## 2020-04-13 ENCOUNTER — Emergency Department (HOSPITAL_COMMUNITY)
Admission: EM | Admit: 2020-04-13 | Discharge: 2020-04-13 | Disposition: A | Discharge status: Home / Self Care | Payer: Self-pay | Attending: Emergency Medicine | Admitting: Emergency Medicine

## 2020-04-13 ENCOUNTER — Emergency Department (HOSPITAL_COMMUNITY): Payer: Self-pay

## 2020-04-13 ENCOUNTER — Other Ambulatory Visit: Payer: Self-pay

## 2020-04-13 DIAGNOSIS — F1990 Other psychoactive substance use, unspecified, uncomplicated: Secondary | ICD-10-CM | POA: Insufficient documentation

## 2020-04-13 DIAGNOSIS — J189 Pneumonia, unspecified organism: Secondary | ICD-10-CM | POA: Insufficient documentation

## 2020-04-13 DIAGNOSIS — Z20822 Contact with and (suspected) exposure to covid-19: Secondary | ICD-10-CM | POA: Insufficient documentation

## 2020-04-13 DIAGNOSIS — F17210 Nicotine dependence, cigarettes, uncomplicated: Secondary | ICD-10-CM | POA: Insufficient documentation

## 2020-04-13 DIAGNOSIS — Z720 Tobacco use: Secondary | ICD-10-CM

## 2020-04-13 LAB — CBC WITH DIFFERENTIAL/PLATELET
Abs Immature Granulocytes: 0.2 10*3/uL — ABNORMAL HIGH (ref 0.00–0.07)
Basophils Absolute: 0.1 10*3/uL (ref 0.0–0.1)
Basophils Relative: 0 %
Eosinophils Absolute: 0 10*3/uL (ref 0.0–0.5)
Eosinophils Relative: 0 %
HCT: 40 % (ref 39.0–52.0)
Hemoglobin: 13.8 g/dL (ref 13.0–17.0)
Immature Granulocytes: 1 %
Lymphocytes Relative: 9 %
Lymphs Abs: 1.6 10*3/uL (ref 0.7–4.0)
MCH: 32.5 pg (ref 26.0–34.0)
MCHC: 34.5 g/dL (ref 30.0–36.0)
MCV: 94.1 fL (ref 80.0–100.0)
Monocytes Absolute: 1.9 10*3/uL — ABNORMAL HIGH (ref 0.1–1.0)
Monocytes Relative: 11 %
Neutro Abs: 14.4 10*3/uL — ABNORMAL HIGH (ref 1.7–7.7)
Neutrophils Relative %: 79 %
Platelets: 348 10*3/uL (ref 150–400)
RBC: 4.25 MIL/uL (ref 4.22–5.81)
RDW: 11.8 % (ref 11.5–15.5)
WBC: 18.1 10*3/uL — ABNORMAL HIGH (ref 4.0–10.5)
nRBC: 0 % (ref 0.0–0.2)

## 2020-04-13 LAB — URINALYSIS, ROUTINE W REFLEX MICROSCOPIC
Bilirubin Urine: NEGATIVE
Glucose, UA: NEGATIVE mg/dL
Hgb urine dipstick: NEGATIVE
Ketones, ur: NEGATIVE mg/dL
Leukocytes,Ua: NEGATIVE
Nitrite: NEGATIVE
Protein, ur: NEGATIVE mg/dL
Specific Gravity, Urine: 1.008 (ref 1.005–1.030)
pH: 6 (ref 5.0–8.0)

## 2020-04-13 LAB — COMPREHENSIVE METABOLIC PANEL
ALT: 62 U/L — ABNORMAL HIGH (ref 0–44)
AST: 80 U/L — ABNORMAL HIGH (ref 15–41)
Albumin: 3.9 g/dL (ref 3.5–5.0)
Alkaline Phosphatase: 53 U/L (ref 38–126)
Anion gap: 13 (ref 5–15)
BUN: <5 mg/dL — ABNORMAL LOW (ref 6–20)
CO2: 23 mmol/L (ref 22–32)
Calcium: 8.8 mg/dL — ABNORMAL LOW (ref 8.9–10.3)
Chloride: 98 mmol/L (ref 98–111)
Creatinine, Ser: 0.89 mg/dL (ref 0.61–1.24)
GFR calc Af Amer: >60 mL/min (ref >60)
GFR calc non Af Amer: >60 mL/min (ref >60)
Glucose, Bld: 136 mg/dL — ABNORMAL HIGH (ref 70–99)
Potassium: 3.6 mmol/L (ref 3.5–5.1)
Sodium: 134 mmol/L — ABNORMAL LOW (ref 135–145)
Total Bilirubin: 0.9 mg/dL (ref 0.3–1.2)
Total Protein: 7.1 g/dL (ref 6.5–8.1)

## 2020-04-13 LAB — LACTIC ACID, PLASMA
Lactic Acid, Venous: 1.5 mmol/L (ref 0.5–1.9)
Lactic Acid, Venous: 0.8 mmol/L (ref 0.5–1.9)

## 2020-04-13 LAB — SARS CORONAVIRUS 2 BY RT PCR (HOSPITAL ORDER, PERFORMED IN ~~LOC~~ HOSPITAL LAB): SARS Coronavirus 2: NEGATIVE

## 2020-04-13 MED ORDER — NAPROXEN 375 MG PO TABS
375.0000 mg | ORAL_TABLET | Freq: Two times a day (BID) | ORAL | 0 refills | Status: AC
Start: 2020-04-13 — End: ?

## 2020-04-13 MED ORDER — SODIUM CHLORIDE 0.9% FLUSH
3.0000 mL | Freq: Once | INTRAVENOUS | Status: AC
  Administered 2020-04-13: 3 mL via INTRAVENOUS

## 2020-04-13 MED ORDER — SODIUM CHLORIDE 0.9 % IV SOLN
1.0000 g | Freq: Once | INTRAVENOUS | Status: AC
  Administered 2020-04-13: 1 g via INTRAVENOUS
  Filled 2020-04-13: qty 10

## 2020-04-13 MED ORDER — BENZONATATE 100 MG PO CAPS: 200.0000 mg | ORAL_CAPSULE | Freq: Two times a day (BID) | ORAL | 0 refills | Status: AC | PRN

## 2020-04-13 MED ORDER — BENZONATATE 100 MG PO CAPS: 200.0000 mg | ORAL_CAPSULE | Freq: Two times a day (BID) | ORAL | 0 refills | Status: DC | PRN

## 2020-04-13 MED ORDER — ACETAMINOPHEN 325 MG PO TABS
650.0000 mg | ORAL_TABLET | Freq: Once | ORAL | Status: AC | PRN
  Administered 2020-04-13: 650 mg via ORAL
  Filled 2020-04-13: qty 2

## 2020-04-13 MED ORDER — AZITHROMYCIN 250 MG PO TABS
500.0000 mg | ORAL_TABLET | Freq: Once | ORAL | Status: AC
  Administered 2020-04-13: 500 mg via ORAL
  Filled 2020-04-13: qty 2

## 2020-04-13 MED ORDER — AZITHROMYCIN 250 MG PO TABS: ORAL_TABLET | ORAL | 0 refills | Status: DC

## 2020-04-13 MED ORDER — NAPROXEN 375 MG PO TABS: 375.0000 mg | ORAL_TABLET | Freq: Two times a day (BID) | ORAL | 0 refills | Status: DC

## 2020-04-13 MED FILL — AZITHROMYCIN 250 MG TABLET: 250 | 5 days supply | Qty: 6 | Fill #0

## 2020-04-13 MED FILL — BENZONATATE 100 MG CAPS: 100 | 5 days supply | Qty: 20 | Fill #0

## 2020-04-13 NOTE — Discharge Instructions (Signed)
You were treated for pneumonia with ceftriaxone and azithromycin. You will continue a course of azithromycin antibiotics at home.The naproxen is for fever and body aches. You may also take tylenol. The Tessalon is for cough.  Please take your medication even if you are feeling better. Get help right away if: You have worsening shortness of breath. You have increased chest pain. Your sickness becomes worse, especially if you are an older adult or have a weakened immune system. You cough up blood.

## 2020-04-13 NOTE — Progress Notes (Addendum)
TOC CM referral for assistance with medications. MATCH utilized and can use once per year with a $3 copay. Pt will need arrange appt with [facility]. Provided pt with his medications from pharmacy. [facility] outpt pharmacy did not have Naproxen in stock. He will need to pick up at a local retail pharmacy. Pt has RX. Pt states he is familiar with the [facility] and free clinic. Provided pt a bus pass for transportation.   Isidoro Donning RN CCM, [facility] ED TOC CM [phone]

## 2020-04-13 NOTE — ED Notes (Signed)
Patient aware we need urine sample.  

## 2020-04-13 NOTE — ED Notes (Signed)
Patient stating he cannot afford his medications and has no insurance.  Cammy Copa, PA made aware.

## 2020-04-13 NOTE — ED Provider Notes (Signed)
[facility] Provider Note   CSN: [id number] Arrival date & time: 04/13/20  3546     History Chief Complaint  Patient presents with  . Fever  . Cocaine use    Gary Foley is a 29 y.o. male.  With past medical history of ITP, cough, polysubstance use.  Patient states that last night he was smoking some marijuana under the line of cocaine felt like his chest was hurting.  He has had a cough all day.  He is a daily smoker.  He has had a cough that been persistent for about 3 days but started running a high fever, got chills, body aches and came to the emergency department for evaluation.  Patient has not had his Covid vaccination.  He denies any vomiting, abdominal pain.  HPI     Past Medical History:  Diagnosis Date  . Cough 06/17/2017  . Idiopathic thrombocytopenia purpura (HCC)    no PCP  . Metacarpal bone fracture 06/12/2017   right small  . Stuffy and runny nose 06/17/2017   green/yellow drainage from nose, per pt.    There are no problems to display for this patient.   Past Surgical History:  Procedure Laterality Date  . LYMPH NODE BIOPSY     neck - as a child  . MANDIBLE FRACTURE SURGERY    . OPEN REDUCTION INTERNAL FIXATION (ORIF) METACARPAL Right 06/18/2017   Procedure: OPEN REDUCTION INTERNAL FIXATION (ORIF) RIGHT SMALL METACARPAL FRACTURE;  Surgeon: Dairl Ponder, MD;  Location: [facility];  Service: Orthopedics;  Laterality: Right;       Family History  Problem Relation Age of Onset  . Stroke Mother     Social History   Tobacco Use  . Smoking status: Current Every Day Smoker    Packs/day: 1.00    Years: 14.00    Pack years: 14.00  . Smokeless tobacco: Never Used  Substance Use Topics  . Alcohol use: Yes    Comment: 1 beer/day  . Drug use: No    Home Medications Prior to Admission medications   Medication Sig Start Date End Date Taking? Authorizing Provider  omeprazole (PRILOSEC) 20 MG  capsule Take 1 capsule (20 mg total) by mouth daily. Patient not taking: Reported on 10/04/2017 08/27/17   Horton, Mayer Masker, MD  oxyCODONE-acetaminophen (PERCOCET/ROXICET) 5-325 MG tablet Take 1 tablet by mouth every 8 (eight) hours as needed for severe pain. 11/03/19   Fayrene Helper, PA-C  permethrin (ELIMITE) 5 % cream Apply to affected area once Patient not taking: Reported on 10/04/2017 07/07/17   Elson Areas, PA-C    Allergies    Patient has no known allergies.  Review of Systems   Review of Systems Ten systems reviewed and are negative for acute change, except as noted in the HPI.   Physical Exam Updated Vital Signs BP 124/67 (BP Location: Right Arm)   Pulse (!) 105   Temp (!) 101.8 F (38.8 C) (Oral)   Resp (!) 23   SpO2 100%   Physical Exam Vitals and nursing note reviewed.  Constitutional:      General: He is not in acute distress.    Appearance: He is well-developed. He is not diaphoretic.  HENT:     Head: Normocephalic and atraumatic.  Eyes:     General: No scleral icterus.    Conjunctiva/sclera: Conjunctivae normal.  Cardiovascular:     Rate and Rhythm: Normal rate and regular rhythm.     Heart  sounds: Normal heart sounds.  Pulmonary:     Effort: Pulmonary effort is normal. No respiratory distress.     Breath sounds: Normal breath sounds.  Abdominal:     Palpations: Abdomen is soft.     Tenderness: There is no abdominal tenderness.  Musculoskeletal:     Cervical back: Normal range of motion and neck supple.  Skin:    General: Skin is warm and dry.  Neurological:     Mental Status: He is alert.  Psychiatric:        Behavior: Behavior normal.     ED Results / Procedures / Treatments   Labs (all labs ordered are listed, but only abnormal results are displayed) Labs Reviewed  COMPREHENSIVE METABOLIC PANEL - Abnormal; Notable for the following components:      Result Value   Sodium 134 (*)    Glucose, Bld 136 (*)    BUN <5 (*)    Calcium 8.8 (*)     AST 80 (*)    ALT 62 (*)    All other components within normal limits  CBC WITH DIFFERENTIAL/PLATELET - Abnormal; Notable for the following components:   WBC 18.1 (*)    Neutro Abs 14.4 (*)    Monocytes Absolute 1.9 (*)    Abs Immature Granulocytes 0.20 (*)    All other components within normal limits  LACTIC ACID, PLASMA  LACTIC ACID, PLASMA  URINALYSIS, ROUTINE W REFLEX MICROSCOPIC    EKG None  Radiology DG Chest 2 View  Result Date: 04/13/2020 CLINICAL DATA:  Cough, fever. EXAM: CHEST - 2 VIEW COMPARISON:  01/04/2011 FINDINGS: Cardiomediastinal contours and hilar structures are normal. Airspace disease in the LEFT lower chest partially obscuring the LEFT heart border on the frontal view. No pneumothorax or pleural effusion. Visualized skeletal structures with limited assessment are unremarkable. IMPRESSION: Findings suspicious for lingular pneumonia. Suggest follow-up to ensure resolution. Electronically Signed   By: Zetta Bills M.D.   On: 04/13/2020 09:51    Procedures Procedures (including critical care time)  Medications Ordered in ED Medications  cefTRIAXone (ROCEPHIN) 1 g in sodium chloride 0.9 % 100 mL IVPB (has no administration in time range)  azithromycin (ZITHROMAX) tablet 500 mg (has no administration in time range)  acetaminophen (TYLENOL) tablet 650 mg (650 mg Oral Given 04/13/20 0748)  sodium chloride flush (NS) 0.9 % injection 3 mL (3 mLs Intravenous Given 04/13/20 1026)    ED Course  I have reviewed the triage vital signs and the nursing notes.  Pertinent labs & imaging results that were available during my care of the patient were reviewed by me and considered in my medical decision making (see chart for details).  Clinical Course as of Apr 13 1114  Thu Apr 13, 2020  1029 WBC(!): 18.1 [AH]    Clinical Course User Index [AH] Margarita Mail, PA-C   MDM Rules/Calculators/A&P                      CC:sob VS: BP 106/65   Pulse 73   Temp (!)  101.8 F (38.8 C) (Oral)   Resp 16   SpO2 97%  IR:SWNIOEV is gathered by patient  and nursing. Previous records obtained and reviewed. DDX:The patient's complaint of sob involves an extensive number of diagnostic and treatment options, and is a complaint that carries with it a high risk of complications, morbidity, and potential mortality. Given the large differential diagnosis, medical decision making is of high complexity. The emergent differential diagnosis  for shortness of breath includes, but is not limited to, Pulmonary edema, bronchoconstriction, Pneumonia, Pulmonary embolism, Pneumotherax/ Hemothorax, Dysrythmia, ACS.  Labs: I ordered reviewed and interpreted labs which include CBC which shows elevated wbc count with left shift, cmp with mildly elevated glucose likely acute phase reaction.  AST and ALT elevated. Imaging: I ordered and reviewed images which included 2 view chest x-ray. I independently visualized and interpreted all imaging. Significant findings include lingular consolidation consistent with pneumonia.  EKG: N/A Consults: MDM: Patient here with community-acquired pneumonia.  Given antipyretics, fluids with resolution of his fever and improvement in his vital signs.  Patient treated with ceftriaxone and azithromycin.  He is currently afebrile, hemodynamically stable and without hypoxia.  Covid test sent.  Patient was discharged on a course of azithromycin, given naproxen and Tessalon at discharge. The patient was counseled on the dangers of tobacco use, and was advised to quit. Reviewed strategies to maximize success, including removing cigarettes and smoking materials from environment, stress management, substitution of other forms of reinforcement, support of family/friends and written materials. . Patient disposition:discharge The patient appears reasonably screened and/or stabilized for discharge and I doubt any other medical condition or other [facility] requiring further screening,  evaluation, or treatment in the ED at this time prior to discharge. I have discussed lab and/or imaging findings with the patient and answered all questions/concerns to the best of my ability.I have discussed return precautions and OP follow up.    Final Clinical Impression(s) / ED Diagnoses Final diagnoses:  None    Rx / DC Orders ED Discharge Orders    None       Arthor Captain, PA-C 04/13/20 1423    Pricilla Loveless, MD 04/14/20 [phone]

## 2020-04-13 NOTE — ED Triage Notes (Signed)
BIB EMS with fever 2-3 days, cough (productive yellow green) Cocaine and ETOHuse last night. 110/70-98-18-96%

## 2020-10-29 ENCOUNTER — Encounter (HOSPITAL_COMMUNITY): Payer: Self-pay | Admitting: Emergency Medicine

## 2020-10-29 ENCOUNTER — Other Ambulatory Visit: Payer: Self-pay

## 2020-10-29 ENCOUNTER — Emergency Department (HOSPITAL_COMMUNITY)
Admission: EM | Admit: 2020-10-29 | Discharge: 2020-10-29 | Disposition: A | Discharge status: Home / Self Care | Payer: Self-pay | Attending: Emergency Medicine | Admitting: Emergency Medicine

## 2020-10-29 DIAGNOSIS — F17200 Nicotine dependence, unspecified, uncomplicated: Secondary | ICD-10-CM | POA: Insufficient documentation

## 2020-10-29 DIAGNOSIS — L0231 Cutaneous abscess of buttock: Secondary | ICD-10-CM | POA: Insufficient documentation

## 2020-10-29 LAB — COMPREHENSIVE METABOLIC PANEL
ALT: 18 U/L (ref 0–44)
AST: 20 U/L (ref 15–41)
Albumin: 3.8 g/dL (ref 3.5–5.0)
Alkaline Phosphatase: 62 U/L (ref 38–126)
Anion gap: 10 (ref 5–15)
BUN: 7 mg/dL (ref 6–20)
CO2: 29 mmol/L (ref 22–32)
Calcium: 9.9 mg/dL (ref 8.9–10.3)
Chloride: 97 mmol/L — ABNORMAL LOW (ref 98–111)
Creatinine, Ser: 0.91 mg/dL (ref 0.61–1.24)
GFR, Estimated: >60 mL/min (ref >60)
Glucose, Bld: 109 mg/dL — ABNORMAL HIGH (ref 70–99)
Potassium: 4 mmol/L (ref 3.5–5.1)
Sodium: 136 mmol/L (ref 135–145)
Total Bilirubin: 0.6 mg/dL (ref 0.3–1.2)
Total Protein: 6.6 g/dL (ref 6.5–8.1)

## 2020-10-29 LAB — URINALYSIS, ROUTINE W REFLEX MICROSCOPIC
Bilirubin Urine: NEGATIVE
Glucose, UA: NEGATIVE mg/dL
Hgb urine dipstick: NEGATIVE
Ketones, ur: NEGATIVE mg/dL
Leukocytes,Ua: NEGATIVE
Nitrite: NEGATIVE
Protein, ur: NEGATIVE mg/dL
Specific Gravity, Urine: 1.01 (ref 1.005–1.030)
pH: 7 (ref 5.0–8.0)

## 2020-10-29 LAB — CBC WITH DIFFERENTIAL/PLATELET
Abs Immature Granulocytes: 0.06 10*3/uL (ref 0.00–0.07)
Basophils Absolute: 0 10*3/uL (ref 0.0–0.1)
Basophils Relative: 0 %
Eosinophils Absolute: 0.1 10*3/uL (ref 0.0–0.5)
Eosinophils Relative: 1 %
HCT: 38.5 % — ABNORMAL LOW (ref 39.0–52.0)
Hemoglobin: 13 g/dL (ref 13.0–17.0)
Immature Granulocytes: 1 %
Lymphocytes Relative: 19 %
Lymphs Abs: 2.1 10*3/uL (ref 0.7–4.0)
MCH: 31 pg (ref 26.0–34.0)
MCHC: 33.8 g/dL (ref 30.0–36.0)
MCV: 91.9 fL (ref 80.0–100.0)
Monocytes Absolute: 1.1 10*3/uL — ABNORMAL HIGH (ref 0.1–1.0)
Monocytes Relative: 10 %
Neutro Abs: 7.3 10*3/uL (ref 1.7–7.7)
Neutrophils Relative %: 69 %
Platelets: 375 10*3/uL (ref 150–400)
RBC: 4.19 MIL/uL — ABNORMAL LOW (ref 4.22–5.81)
RDW: 11.6 % (ref 11.5–15.5)
WBC: 10.7 10*3/uL — ABNORMAL HIGH (ref 4.0–10.5)
nRBC: 0 % (ref 0.0–0.2)

## 2020-10-29 LAB — LACTIC ACID, PLASMA
Lactic Acid, Venous: 0.9 mmol/L (ref 0.5–1.9)
Lactic Acid, Venous: 0.7 mmol/L (ref 0.5–1.9)

## 2020-10-29 MED ORDER — LIDOCAINE-EPINEPHRINE-TETRACAINE (LET) SOLUTION
3.0000 mL | Freq: Once | NASAL | Status: DC
  Filled 2020-10-29: qty 3

## 2020-10-29 MED ORDER — CLINDAMYCIN HCL 150 MG PO CAPS: 300.0000 mg | ORAL_CAPSULE | Freq: Three times a day (TID) | ORAL | 0 refills | Status: AC

## 2020-10-29 MED ORDER — LIDOCAINE-EPINEPHRINE 1 %-1:100000 IJ SOLN
20.0000 mL | Freq: Once | INTRAMUSCULAR | Status: DC
  Filled 2020-10-29: qty 1

## 2020-10-29 MED ORDER — CLINDAMYCIN HCL 150 MG PO CAPS
300.0000 mg | ORAL_CAPSULE | Freq: Once | ORAL | Status: AC
  Administered 2020-10-29: 15:00:00 300 mg via ORAL
  Filled 2020-10-29: qty 2

## 2020-10-29 MED ORDER — LIDOCAINE-EPINEPHRINE 1 %-1:100000 IJ SOLN
20.0000 mL | Freq: Once | INTRAMUSCULAR | Status: AC
  Administered 2020-10-29: 15:00:00 20 mL via INTRADERMAL
  Filled 2020-10-29: qty 1

## 2020-10-29 MED ORDER — CLINDAMYCIN HCL 150 MG PO CAPS
300.0000 mg | ORAL_CAPSULE | Freq: Three times a day (TID) | ORAL | Status: DC
  Filled 2020-10-29 (×2): qty 18

## 2020-10-29 MED ORDER — CLINDAMYCIN HCL 150 MG PO CAPS: 300.0000 mg | ORAL_CAPSULE | Freq: Three times a day (TID) | ORAL | Status: DC

## 2020-10-29 NOTE — ED Provider Notes (Signed)
[facility] EMERGENCY DEPARTMENT Provider Note   CSN: [id number] Arrival date & time: 10/29/20  3151     History Chief Complaint  Patient presents with  . Abscess    Gary Foley is a 29 y.o. male who presents emergency department with chief complaint of abscess. Patient states that he developed an abscess on his right buttock which he squeezed and popped and it improved and then seems to have moved over to the left side it has been progressively worsening. He complains of very severe pain. He denies fevers or chills. He admits to drug use "what ever I can get my hands on." This includes intravenous drug abuse. He has a history of idiopathic thrombocytopenia purpura, he is a daily smoker. He denies a history of splenectomy.  HPI     Past Medical History:  Diagnosis Date  . Cough 06/17/2017  . Idiopathic thrombocytopenia purpura (HCC)    no PCP  . Metacarpal bone fracture 06/12/2017   right small  . Stuffy and runny nose 06/17/2017   green/yellow drainage from nose, per pt.    There are no problems to display for this patient.   Past Surgical History:  Procedure Laterality Date  . LYMPH NODE BIOPSY     neck - as a child  . MANDIBLE FRACTURE SURGERY    . OPEN REDUCTION INTERNAL FIXATION (ORIF) METACARPAL Right 06/18/2017   Procedure: OPEN REDUCTION INTERNAL FIXATION (ORIF) RIGHT SMALL METACARPAL FRACTURE;  Surgeon: Dairl Ponder, MD;  Location: [facility];  Service: Orthopedics;  Laterality: Right;       Family History  Problem Relation Age of Onset  . Stroke Mother     Social History   Tobacco Use  . Smoking status: Current Every Day Smoker    Packs/day: 1.00    Years: 14.00    Pack years: 14.00  . Smokeless tobacco: Never Used  Vaping Use  . Vaping Use: Never used  Substance Use Topics  . Alcohol use: Yes    Comment: 1 beer/day  . Drug use: No    Home Medications Prior to Admission medications   Medication Sig  Start Date End Date Taking? Authorizing Provider  azithromycin (ZITHROMAX Z-PAK) 250 MG tablet 500mg  PO day 1, then 250mg  PO days 2-5 04/13/20   Sanel Stemmer, PA-C  benzonatate (TESSALON) 100 MG capsule Take 2 capsules (200 mg total) by mouth 2 (two) times daily as needed for cough. 04/13/20   Lyndel Dancel, 04/15/20, PA-C  naproxen (NAPROSYN) 375 MG tablet Take 1 tablet (375 mg total) by mouth 2 (two) times daily. 04/13/20   Cammy Copa, PA-C  omeprazole (PRILOSEC) 20 MG capsule Take 1 capsule (20 mg total) by mouth daily. Patient not taking: Reported on 10/04/2017 08/27/17   Horton, 10/06/2017, MD  oxyCODONE-acetaminophen (PERCOCET/ROXICET) 5-325 MG tablet Take 1 tablet by mouth every 8 (eight) hours as needed for severe pain. Patient not taking: Reported on 04/13/2020 11/03/19   04/15/2020, PA-C  permethrin (ELIMITE) 5 % cream Apply to affected area once Patient not taking: Reported on 10/04/2017 07/07/17   10/06/2017, PA-C    Allergies    Patient has no known allergies.  Review of Systems   Review of Systems Ten systems reviewed and are negative for acute change, except as noted in the HPI.   Physical Exam Updated Vital Signs BP 105/72   Pulse 71   Temp 98.3 F (36.8 C) (Oral)   Resp 16   SpO2 100%  Physical Exam Vitals and nursing note reviewed.  Constitutional:      General: He is not in acute distress.    Appearance: He is well-developed and well-nourished. He is not diaphoretic.  HENT:     Head: Normocephalic and atraumatic.  Eyes:     General: No scleral icterus.    Conjunctiva/sclera: Conjunctivae normal.  Cardiovascular:     Rate and Rhythm: Normal rate and regular rhythm.     Heart sounds: Normal heart sounds.  Pulmonary:     Effort: Pulmonary effort is normal. No respiratory distress.     Breath sounds: Normal breath sounds.  Abdominal:     Palpations: Abdomen is soft.     Tenderness: There is no abdominal tenderness.  Musculoskeletal:        General: No  edema.     Cervical back: Normal range of motion and neck supple.     Comments: R Buttock with Large/ Indurated abscess   Skin:    General: Skin is warm and dry.  Neurological:     Mental Status: He is alert.  Psychiatric:        Behavior: Behavior normal.     ED Results / Procedures / Treatments   Labs (all labs ordered are listed, but only abnormal results are displayed) Labs Reviewed  COMPREHENSIVE METABOLIC PANEL - Abnormal; Notable for the following components:      Result Value   Chloride 97 (*)    Glucose, Bld 109 (*)    All other components within normal limits  CBC WITH DIFFERENTIAL/PLATELET - Abnormal; Notable for the following components:   WBC 10.7 (*)    RBC 4.19 (*)    HCT 38.5 (*)    Monocytes Absolute 1.1 (*)    All other components within normal limits  LACTIC ACID, PLASMA  LACTIC ACID, PLASMA  URINALYSIS, ROUTINE W REFLEX MICROSCOPIC    EKG None  Radiology No results found.  Procedures .Marland KitchenIncision and Drainage  Date/Time: 10/29/2020 4:17 PM Performed by: Arthor Captain, PA-C Authorized by: Arthor Captain, PA-C   Consent:    Consent obtained:  Verbal   Consent given by:  Patient   Risks discussed:  Bleeding, incomplete drainage, pain and damage to other organs   Alternatives discussed:  No treatment Universal protocol:    Procedure explained and questions answered to patient or proxy's satisfaction: yes     Relevant documents present and verified: yes     Test results available : yes     Imaging studies available: yes     Required blood products, implants, devices, and special equipment available: yes     Site/side marked: yes     Immediately prior to procedure, a time out was called: yes     Patient identity confirmed:  Verbally with patient Location:    Type:  Abscess   Location:  Anogenital   Anogenital location: R buttock. Pre-procedure details:    Skin preparation:  Betadine Anesthesia:    Anesthesia method:  Local  infiltration   Local anesthetic:  Lidocaine 1% WITH epi Procedure type:    Complexity:  Complex Procedure details:    Incision types:  Single straight   Incision depth:  Subcutaneous   Scalpel blade:  11   Wound management:  Probed and deloculated, irrigated with saline, extensive cleaning and debrided   Drainage:  Purulent   Drainage amount:  Moderate   Packing materials:  1/4 in iodoform gauze Post-procedure details:    Procedure completion:  Tolerated well, no  immediate complications   (including critical care time)  Medications Ordered in ED Medications - No data to display  ED Course  I have reviewed the triage vital signs and the nursing notes.  Pertinent labs & imaging results that were available during my care of the patient were reviewed by me and considered in my medical decision making (see chart for details).    MDM Rules/Calculators/A&P                          BRYSON GAVIA presents with abscess. There is no area of retained pus after procedure. The presentation of ENIS RIECKE is NOT consistent with necrotizing fascitis or osteomyolitis. There is no evidence of retained foreign body, neurovascular or tendon injury. The presentation of OTHAL KUBITZ is NOT consistent with sepsis and/or bacteremia. Rennis Golden meets outpatient criteria for treatment plan to send the patient home with clindamycin however he eloped prior to my discharge papers and medications being given.  Strict return and follow-up precautions have been given by me personally or by detailed written instructions verbalized by nursing staff using the teach back method to the patient/family/caregiver(s).  Data Reviewed/Counseling: I have reviewed the patient's vital signs, nursing notes, and other relevant tests/information. I had a detailed discussion regarding the historical points, exam findings, and any diagnostic results supporting the discharge diagnosis. I also discussed the need for  outpatient follow-up and the need to return to the ED if symptoms worsen or if there are any questions or concerns that arise at home.    Final Clinical Impression(s) / ED Diagnoses Final diagnoses:  None    Rx / DC Orders ED Discharge Orders    None       Arthor Captain, PA-C 10/29/20 1622    Linwood Dibbles, MD 10/30/20 1455

## 2020-10-29 NOTE — TOC Initial Note (Addendum)
Transition of Care [facility]) - Initial/Assessment Note    Patient Details  Name: THERON CUMBIE MRN: [id number] Date of Birth: 1991-05-31  Transition of Care [facility]) CM/SW Contact:    Lockie Pares, RN Phone Number: 10/29/2020, 3:25 PM  Clinical Narrative:                  Patient has already used the [facility] program , just a few months ago, Is ineligible for [facility]. Needs to follow up at [facility] on Monday.  Will call Pharmacy regarding ZZ program once we know what medication is needed.  1600 script tubed to pharmacy for zz program. Spoke with [employer] regarding this service. They will call pharmacisit  In Ed when completed to pick up and bring to patient.  Patient must call [facility] on Monday to obtain the rest of his Antibiotics., set up with a PCP. He will also need to return to the ED to take the packing out.    Barriers to Discharge: Inadequate or no insurance   Patient Goals and CMS Choice        Expected Discharge Plan and Services                                                Prior Living Arrangements/Services                       Activities of Daily Living      Permission Sought/Granted                  Emotional Assessment              Admission diagnosis:  vomiting, abscess There are no problems to display for this patient.  PCP:  Patient, No Pcp Per Pharmacy:   [id number] [place], [place] - [address] [place] [address] Phone: [phone] Fax: [phone]  [facility] [address] [place] - [address] [place] [address] Phone: [phone] Fax: [phone]  [facility] - [place], [place] - [address] [place] [address] [place] [address] Phone: [phone] Fax: [phone]     Social Determinants of Health (SDOH) Interventions    Readmission Risk Interventions No flowsheet data found.

## 2020-10-29 NOTE — ED Triage Notes (Signed)
Pt lying in floor in lobby when called for triage.  Reports large abscess to R buttocks x 1 month.  Also reports vomiting, dizziness, and confusion.  Pt dressed in coat, underwear that was only around L side of buttocks and a Frozen towel around his waist.  Now lying prone on stretcher chair.

## 2020-10-29 NOTE — Discharge Instructions (Signed)
Contact a health care provider if you have:  More redness, swelling, or pain around your abscess.  More fluid or blood coming from your abscess.  Warm skin around your abscess.  More pus or a bad smell coming from your abscess.  A fever.  Muscle aches.  Chills or a general ill feeling.  Get help right away if you:  Have severe pain.  See red streaks on your skin spreading away from the abscess.

## 2020-10-29 NOTE — ED Notes (Signed)
Pt left prior to being discharged by this RN

## 2020-10-31 LAB — AEROBIC CULTURE W GRAM STAIN (SUPERFICIAL SPECIMEN)

## 2020-11-01 NOTE — Progress Notes (Signed)
ED Antimicrobial Stewardship Positive Culture Follow Up   Gary Foley is an 29 y.o. male who presented to [facility] on 10/29/2020 with a chief complaint of R buttocks abscess.  Chief Complaint  Patient presents with  . Abscess    Recent Results (from the past 720 hour(s))  Aerobic Culture (superficial specimen)     Status: None   Collection Time: 10/29/20  1:56 PM   Specimen: Buttocks; Abscess  Result Value Ref Range Status   Specimen Description BUTTOCKS  Final   Special Requests Immunocompromised  Final   Gram Stain   Final    RARE WBC PRESENT, PREDOMINANTLY PMN NO ORGANISMS SEEN Performed at [facility], [address], [place], [place] [address]    Culture   Final    MODERATE METHICILLIN RESISTANT STAPHYLOCOCCUS AUREUS   Report Status 10/31/2020 FINAL  Final   Organism ID, Bacteria METHICILLIN RESISTANT STAPHYLOCOCCUS AUREUS  Final      Susceptibility   Methicillin resistant staphylococcus aureus - MIC*    CIPROFLOXACIN >=8 RESISTANT Resistant     ERYTHROMYCIN >=8 RESISTANT Resistant     GENTAMICIN <=0.5 SENSITIVE Sensitive     OXACILLIN >=4 RESISTANT Resistant     TETRACYCLINE <=1 SENSITIVE Sensitive     VANCOMYCIN <=0.5 SENSITIVE Sensitive     TRIMETH/SULFA 160 RESISTANT Resistant     CLINDAMYCIN <=0.25 SENSITIVE Sensitive     RIFAMPIN <=0.5 SENSITIVE Sensitive     Inducible Clindamycin NEGATIVE Sensitive     * MODERATE METHICILLIN RESISTANT STAPHYLOCOCCUS AUREUS    Patient left prior to discharge instruction or medication given. It is unclear if patient receive clindamycin prescription as intended. Please call patient and if he received clindamycin advise to continue taking and complete the course. If he did not receive clindamycin please start doxycycline 100mg  by mouth twice daily for 7 days.   ED Provider: , PA-C   Loleta Dicker 11/01/2020, 1:11 PM Clinical Pharmacist Monday - Friday phone -  [phone] Saturday - Sunday phone  - [phone]

## 2020-11-25 ENCOUNTER — Other Ambulatory Visit: Payer: Self-pay

## 2020-11-25 ENCOUNTER — Emergency Department (HOSPITAL_COMMUNITY)
Admission: EM | Admit: 2020-11-25 | Discharge: 2020-11-26 | Disposition: A | Discharge status: Home / Self Care | Payer: Self-pay

## 2020-11-25 NOTE — ED Notes (Signed)
Pt left his stickers with the screeners and has left the department.

## 2021-01-05 ENCOUNTER — Encounter: Payer: Self-pay | Admitting: *Deleted

## 2021-01-05 ENCOUNTER — Emergency Department (HOSPITAL_COMMUNITY)
Admission: EM | Admit: 2021-01-05 | Discharge: 2021-01-05 | Disposition: A | Discharge status: Home / Self Care | Payer: Self-pay | Attending: Emergency Medicine | Admitting: Emergency Medicine

## 2021-01-05 ENCOUNTER — Encounter (HOSPITAL_COMMUNITY): Payer: Self-pay | Admitting: Emergency Medicine

## 2021-01-05 DIAGNOSIS — F17200 Nicotine dependence, unspecified, uncomplicated: Secondary | ICD-10-CM | POA: Insufficient documentation

## 2021-01-05 DIAGNOSIS — L03211 Cellulitis of face: Secondary | ICD-10-CM | POA: Insufficient documentation

## 2021-01-05 DIAGNOSIS — J340 Abscess, furuncle and carbuncle of nose: Secondary | ICD-10-CM

## 2021-01-05 MED ORDER — DOXYCYCLINE HYCLATE 100 MG PO TABS
100.0000 mg | ORAL_TABLET | Freq: Once | ORAL | Status: AC
  Administered 2021-01-05: 100 mg via ORAL
  Filled 2021-01-05: qty 1

## 2021-01-05 MED ORDER — MUPIROCIN CALCIUM 2 % EX CREA
TOPICAL_CREAM | Freq: Once | CUTANEOUS | Status: AC
  Filled 2021-01-05: qty 15

## 2021-01-05 MED ORDER — DOXYCYCLINE HYCLATE 100 MG PO CAPS: 100.0000 mg | ORAL_CAPSULE | Freq: Two times a day (BID) | ORAL | 0 refills | Status: AC

## 2021-01-05 NOTE — ED Triage Notes (Signed)
Pt reports pain inside nose and along left side of his neck. Also c/o left wrist pain after a fall approx 1 month ago.

## 2021-01-05 NOTE — Progress Notes (Signed)
RNCM notes order for medication assistance for uninsured pt.  PT discharged prior to [facility] arrival.

## 2021-01-05 NOTE — ED Provider Notes (Signed)
[facility] EM[facility] Provider Note   CSN: [id number] Arrival date & time: 01/05/21  0302     History Chief Complaint  Patient presents with  . Facial Pain    Gary Foley is a 30 y.o. male.  Gary Foley is a 30 y.o. male with a history of ITP, who presents to the emergency department for evaluation of pain and swelling to the tip and inside of his nose that has been present for the past 3 days.  He denies congestion or rhinorrhea, no sore throat.  But reports that the tip and just inside of his nose is painful, red and swollen.  He picked that an area a few days ago and have a small amount of bleeding and pain has worsened since then.  He also thinks that some of the lymph nodes in his neck are swollen.  No intraoral pain or pain.  No fevers or chills.  No other aggravating or alleviating factors.        Past Medical History:  Diagnosis Date  . Cough 06/17/2017  . Idiopathic thrombocytopenia purpura (HCC)    no PCP  . Metacarpal bone fracture 06/12/2017   right small  . Stuffy and runny nose 06/17/2017   green/yellow drainage from nose, per pt.    There are no problems to display for this patient.   Past Surgical History:  Procedure Laterality Date  . LYMPH NODE BIOPSY     neck - as a child  . MANDIBLE FRACTURE SURGERY    . OPEN REDUCTION INTERNAL FIXATION (ORIF) METACARPAL Right 06/18/2017   Procedure: OPEN REDUCTION INTERNAL FIXATION (ORIF) RIGHT SMALL METACARPAL FRACTURE;  Surgeon: Dairl Ponder, MD;  Location: [facility];  Service: Orthopedics;  Laterality: Right;       Family History  Problem Relation Age of Onset  . Stroke Mother     Social History   Tobacco Use  . Smoking status: Current Every Day Smoker    Packs/day: 1.00    Years: 14.00    Pack years: 14.00  . Smokeless tobacco: Never Used  Vaping Use  . Vaping Use: Never used  Substance Use Topics  . Alcohol use: Yes    Comment: 1 beer/day   . Drug use: No    Home Medications Prior to Admission medications   Medication Sig Start Date End Date Taking? Authorizing Provider  benzonatate (TESSALON) 100 MG capsule Take 2 capsules (200 mg total) by mouth 2 (two) times daily as needed for cough. Patient not taking: No sig reported 04/13/20   Arthor Captain, PA-C  clindamycin (CLEOCIN) 150 MG capsule Take 2 capsules (300 mg total) by mouth 3 (three) times daily. May dispense as 150mg  capsules 10/29/20   Harris, Abigail, PA-C  naproxen (NAPROSYN) 375 MG tablet Take 1 tablet (375 mg total) by mouth 2 (two) times daily. Patient not taking: No sig reported 04/13/20   04/15/20, PA-C  omeprazole (PRILOSEC) 20 MG capsule Take 1 capsule (20 mg total) by mouth daily. Patient not taking: No sig reported 08/27/17   Horton, 10/27/17, MD  oxyCODONE-acetaminophen (PERCOCET/ROXICET) 5-325 MG tablet Take 1 tablet by mouth every 8 (eight) hours as needed for severe pain. Patient not taking: No sig reported 11/03/19   11/05/19, PA-C  permethrin (ELIMITE) 5 % cream Apply to affected area once Patient not taking: No sig reported 07/07/17   07/09/17, PA-C    Allergies    Patient has no known  allergies.  Review of Systems   Review of Systems  Constitutional: Negative for chills and fever.  HENT: Positive for facial swelling. Negative for ear pain, nosebleeds, postnasal drip, rhinorrhea, sinus pressure, sneezing and sore throat.   Respiratory: Negative for cough and shortness of breath.   Cardiovascular: Negative for chest pain.  Gastrointestinal: Negative for nausea and vomiting.  Musculoskeletal: Negative for arthralgias and myalgias.  Skin: Positive for color change. Negative for rash.  All other systems reviewed and are negative.   Physical Exam Updated Vital Signs BP 136/81 (BP Location: Right Arm)   Pulse 89   Temp 99 F (37.2 C) (Oral)   Resp 16   Ht 6\' 1"  (1.854 m)   Wt 74.8 kg   SpO2 95%   BMI 21.77 kg/m    Physical Exam Vitals and nursing note reviewed.  Constitutional:      General: He is not in acute distress.    Appearance: Normal appearance. He is well-developed, normal weight and well-nourished. He is not ill-appearing or diaphoretic.  HENT:     Head: Normocephalic and atraumatic.     Nose: No congestion or rhinorrhea.     Comments: There is some erythema to the tip of the nose with slight swelling to the tip of the nose and tenderness. There is some crusting and erythema inside at the tip of the nose without fluctuance of signs of abscess No sinus tenderness    Mouth/Throat:     Mouth: Mucous membranes are moist.     Pharynx: Oropharynx is clear.     Comments: No swelling or redness in perioral region, no dental pain or signs of dental abscess Eyes:     General:        Right eye: No discharge.        Left eye: No discharge.  Pulmonary:     Effort: Pulmonary effort is normal. No respiratory distress.  Musculoskeletal:     Cervical back: Neck supple.  Lymphadenopathy:     Cervical: No cervical adenopathy.  Skin:    General: Skin is warm and dry.  Neurological:     Mental Status: He is alert and oriented to person, place, and time.     Coordination: Coordination normal.  Psychiatric:        Mood and Affect: Mood and affect and mood normal.        Behavior: Behavior normal.     ED Results / Procedures / Treatments   Labs (all labs ordered are listed, but only abnormal results are displayed) Labs Reviewed - No data to display  EKG None  Radiology No results found.  Procedures Procedures   Medications Ordered in ED Medications  mupirocin cream (BACTROBAN) 2 % ( Topical Given 01/05/21 0515)  doxycycline (VIBRA-TABS) tablet 100 mg (100 mg Oral Given 01/05/21 0441)    ED Course  I have reviewed the triage vital signs and the nursing notes.  Pertinent labs & imaging results that were available during my care of the patient were reviewed by me and considered in my  medical decision making (see chart for details).    MDM Rules/Calculators/A&P                         Patient presents with redness, pain and swelling in the tip of the nose concerning for cellulitis. No evidence of abscess.  No fevers patient with normal vitals and well-appearing.  Will treat with mupirocin ointment in each of the  naris and doxycycline.  Patient given tube of mupirocin ointment here in the emergency department.  Reports he will have difficulty affording medications, I printed a good Rx coupon for him for doxycycline and also placed case management consult to see if they could help with medication assistance in the morning.  Return precautions discussed.  Patient expresses understanding and agreement with plan.  Discharged home in good condition.  Final Clinical Impression(s) / ED Diagnoses Final diagnoses:  Cellulitis of nose    Rx / DC Orders ED Discharge Orders         Ordered    doxycycline (VIBRAMYCIN) 100 MG capsule  2 times daily        01/05/21 0539           Dartha Lodge, PA-C 01/06/21 1649    Gilda Crease, MD 01/10/21 [phone]

## 2021-01-05 NOTE — Discharge Instructions (Signed)
Use mupirocin ointment in the nose twice daily for 10 days.  I would also like for you to take doxycycline twice a day for 7 days to treat likely cellulitis in the nose.  I have printed a coupon for this medication but have also placed a consult to have our social worker reach out to try and get the cost of this medication covered for you.  They will likely try and call you to arrange this in the morning.  If you develop worsening nose redness, pain or swelling, fevers or any other new or concerning symptoms please return for reevaluation.
